# Patient Record
Sex: Female | Born: 1990 | Race: White | Hispanic: No | Marital: Married | Smoking: Never smoker
Health system: Southern US, Community
[De-identification: ages and names within clinical notes are randomized; demographics above are authoritative.]

## PROBLEM LIST (undated history)

## (undated) DIAGNOSIS — Z789 Other specified health status: Secondary | ICD-10-CM

## (undated) HISTORY — DX: Other specified health status: Z78.9

## (undated) HISTORY — PX: NO PAST SURGERIES: SHX2092

---

## 2011-09-18 ENCOUNTER — Ambulatory Visit (INDEPENDENT_AMBULATORY_CARE_PROVIDER_SITE_OTHER): Payer: BC Managed Care – PPO

## 2011-09-18 DIAGNOSIS — J069 Acute upper respiratory infection, unspecified: Secondary | ICD-10-CM

## 2012-08-30 ENCOUNTER — Ambulatory Visit (INDEPENDENT_AMBULATORY_CARE_PROVIDER_SITE_OTHER): Payer: BC Managed Care – PPO | Admitting: Physician Assistant

## 2012-08-30 VITALS — BP 118/66 | HR 90 | Temp 98.4°F | Resp 16 | Ht 66.0 in | Wt 110.2 lb

## 2012-08-30 DIAGNOSIS — J029 Acute pharyngitis, unspecified: Secondary | ICD-10-CM

## 2012-08-30 DIAGNOSIS — R05 Cough: Secondary | ICD-10-CM

## 2012-08-30 DIAGNOSIS — J020 Streptococcal pharyngitis: Secondary | ICD-10-CM

## 2012-08-30 LAB — POCT RAPID STREP A (OFFICE): Rapid Strep A Screen: POSITIVE — AB

## 2012-08-30 MED ORDER — AMOXICILLIN 875 MG PO TABS: 875.0000 mg | ORAL_TABLET | Freq: Two times a day (BID) | ORAL | Status: DC

## 2012-08-30 MED ORDER — PENICILLIN G BENZATHINE 1200000 UNIT/2ML IM SUSP
1.2000 10*6.[IU] | Freq: Once | INTRAMUSCULAR | Status: AC
  Administered 2012-08-30: 1.2 10*6.[IU] via INTRAMUSCULAR

## 2012-08-30 MED ORDER — HYDROCOD POLST-CHLORPHEN POLST 10-8 MG/5ML PO LQCR: 5.0000 mL | Freq: Two times a day (BID) | ORAL | Status: DC | PRN

## 2012-08-30 NOTE — Progress Notes (Signed)
  Subjective:    Patient ID: Patricia Swanson, female    DOB: 1991-05-18, 21 y.o.   MRN: [id number]  HPI This 21 y.o. female presents for evaluation of sore throat. Sore throat began yesterday, awoke this morning early with post-nasal drainage.  Has had a cough for 1-2 weeks, sometimes productive.  No fever, chills, GI/GU symptoms.  No known sick contacts.  Review of Systems As above.   History reviewed. No pertinent past medical history.  History reviewed. No pertinent past surgical history.  Prior to Admission medications   Not on File    No Known Allergies  History   Social History  . Marital Status: Single    Spouse Name: N/A    Number of Children: 0  . Years of Education: N/A   Occupational History  . Student     [employer]   Social History Main Topics  . Smoking status: Never Smoker   . Smokeless tobacco: Never Used  . Alcohol Use: No  . Drug Use: No  . Sexually Active: No   Other Topics Concern  . 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  Hopes to become a Marine scientist. Lives off campus with a roommate. Family lives in [place], [place].    Family History  Problem Relation Age of Onset  . Diabetes Maternal Grandmother   . Diabetes Paternal Grandmother        Objective:   Physical Exam Blood pressure 118/66, pulse 90, temperature 98.4 F (36.9 C), temperature source Oral, resp. rate 16, height 5\' 6"  (1.676 m), weight 110 lb 3.2 oz (49.986 kg), SpO2 100.00%. Body mass index is 17.79 kg/(m^2). Well-developed, well nourished WF who is awake, alert and oriented, in NAD. HEENT: /AT, PERRL, EOMI.  Sclera and conjunctiva are clear.  EAC are patent, TMs are normal in appearance. Nasal mucosa is pink and moist. OP is notable for vasculitic-type erythematous lesions on the soft-palate. No tonsillar exudate or erythema or enlargement. Neck: supple, non-tender, no lymphadenopathy, thyromegaly. Heart: RRR, no murmur Lungs: normal effort,  CTA Abdomen: normo-active bowel sounds, supple, non-tender, no mass or organomegaly. Extremities: no cyanosis, clubbing or edema. Skin: warm and dry without rash. Psychologic: good mood and appropriate affect, normal speech and behavior.  Results for orders placed in visit on 08/30/12  POCT RAPID STREP A (OFFICE)      Component Value Range   Rapid Strep A Screen Positive (*) Negative      Assessment & Plan:   1. Streptococcal sore throat  penicillin g benzathine (BICILLIN LA) 1200000 UNIT/2ML injection 1.2 Million Units, DISCONTINUED: amoxicillin (AMOXIL) 875 MG tablet  2. Acute pharyngitis  POCT rapid strep A  3. Cough  chlorpheniramine-HYDROcodone (TUSSIONEX PENNKINETIC ER) 10-8 MG/5ML [facility]   Supportive care.

## 2012-08-30 NOTE — Patient Instructions (Signed)

## 2015-05-03 ENCOUNTER — Encounter: Payer: Self-pay | Admitting: Emergency Medicine

## 2015-05-03 ENCOUNTER — Ambulatory Visit
Admission: EM | Admit: 2015-05-03 | Discharge: 2015-05-03 | Disposition: A | Discharge status: Home / Self Care | Payer: Managed Care, Other (non HMO) | Attending: Internal Medicine | Admitting: Internal Medicine

## 2015-05-03 DIAGNOSIS — Z79899 Other long term (current) drug therapy: Secondary | ICD-10-CM | POA: Diagnosis not present

## 2015-05-03 DIAGNOSIS — N63 Unspecified lump in breast: Secondary | ICD-10-CM | POA: Diagnosis present

## 2015-05-03 DIAGNOSIS — N6310 Unspecified lump in the right breast, unspecified quadrant: Secondary | ICD-10-CM

## 2015-05-03 LAB — COMPREHENSIVE METABOLIC PANEL
ALBUMIN: 5.1 g/dL — AB (ref 3.5–5.0)
ALK PHOS: 50 U/L (ref 38–126)
ALT: 13 U/L — AB (ref 14–54)
ANION GAP: 9 (ref 5–15)
AST: 19 U/L (ref 15–41)
BUN: 11 mg/dL (ref 6–20)
CALCIUM: 9.8 mg/dL (ref 8.9–10.3)
CO2: 26 mmol/L (ref 22–32)
Chloride: 103 mmol/L (ref 101–111)
Creatinine, Ser: 0.82 mg/dL (ref 0.44–1.00)
GFR calc Af Amer: >60 mL/min (ref >60)
Glucose, Bld: 108 mg/dL — ABNORMAL HIGH (ref 65–99)
POTASSIUM: 4 mmol/L (ref 3.5–5.1)
SODIUM: 138 mmol/L (ref 135–145)
TOTAL PROTEIN: 7.7 g/dL (ref 6.5–8.1)
Total Bilirubin: 0.5 mg/dL (ref 0.3–1.2)

## 2015-05-03 LAB — CBC WITH DIFFERENTIAL/PLATELET
BASOS ABS: 0.1 10*3/uL (ref 0–0.1)
BASOS PCT: 1 %
Eosinophils Absolute: 0.2 10*3/uL (ref 0–0.7)
Eosinophils Relative: 3 %
HCT: 38.8 % (ref 35.0–47.0)
Hemoglobin: 13.3 g/dL (ref 12.0–16.0)
LYMPHS ABS: 1.5 10*3/uL (ref 1.0–3.6)
LYMPHS PCT: 25 %
MCH: 30.6 pg (ref 26.0–34.0)
MCHC: 34.2 g/dL (ref 32.0–36.0)
MCV: 89.3 fL (ref 80.0–100.0)
MONO ABS: 0.3 10*3/uL (ref 0.2–0.9)
Monocytes Relative: 6 %
Neutro Abs: 4.1 10*3/uL (ref 1.4–6.5)
Neutrophils Relative %: 65 %
Platelets: 240 10*3/uL (ref 150–440)
RBC: 4.35 MIL/uL (ref 3.80–5.20)
RDW: 12.4 % (ref 11.5–14.5)
WBC: 6.1 10*3/uL (ref 3.6–11.0)

## 2015-05-03 NOTE — Discharge Instructions (Signed)
Your breast lump seems most likely to be a cyst. An ultrasound will help determine more precisely what it is;  Consultation with a breast specialist may be helpful in addition. I gave you the office number for Dr Olevia Perches, in [place], as a possible PCP. Labs today at the urgent care (CBC, CMP) were unremarkable. Breast ultrasound scheduled for Monday 7/25 at 2:20pm, at the [facility] Call [phone] to see if earlier appointment comes open.  Breast Cyst A breast cyst is a sac in the breast that is filled with fluid. Breast cysts are common in women. Women can have one or many cysts. When the breasts contain many cysts, it is usually due to a noncancerous (benign) condition called fibrocystic change. These lumps form under the influence of female hormones (estrogen and progesterone). The lumps are most often located in the upper, outer portion of the breast. They are often more swollen, painful, and tender before your period starts. They usually disappear after menopause, unless you are on hormone therapy.  There are several types of cysts:  Macrocyst. This is a cyst that is about 2 in. (5.1 cm) in diameter.   Microcyst. This is a tiny cyst that you cannot feel but can be seen with a mammogram or an ultrasound.   Galactocele. This is a cyst containing milk that may develop if you suddenly stop breastfeeding.   Sebaceous cyst of the skin. This type of cyst is not in the breast tissue itself. Breast cysts do not increase your risk of breast cancer. However, they must be monitored closely because they can be cancerous.  CAUSES  It is not known exactly what causes a breast cyst to form. Possible causes include:  An overgrowth of milk glands and connective tissue in the breast can block the milk glands, causing them to fill with fluid.   Scar tissue in the breast from previous surgery may block the glands, causing a cyst.  RISK FACTORS Estrogen may influence the  development of a breast cyst.  SIGNS AND SYMPTOMS   Feeling a smooth, round, soft lump (like a grape) in the breast that is easily moveable.   Breast discomfort or pain.  Increase in size of the lump before your menstrual period and decrease in its size after your menstrual period.  DIAGNOSIS  A cyst can be felt during a physical exam by your health care provider. A breast X-ray exam (mammogram) and ultrasonography will be done to confirm the diagnosis. Fluid may be removed from the cyst with a needle (fine needle aspiration) to make sure the cyst is not cancerous.  TREATMENT  Treatment may not be necessary. Your health care provider may monitor the cyst to see if it goes away on its own. If treatment is needed, it may include:  Hormone treatment.   Needle aspiration. There is a chance of the cyst coming back after aspiration.   Surgery to remove the whole cyst.  HOME CARE INSTRUCTIONS   Keep all follow-up appointments with your health care provider.  See your health care provider regularly:  Get a yearly exam by your health care provider.  Have a clinical breast exam by a health care provider every 1-3 years if you are 48-20 years of age. After age 57 years, you should have the exam every year.   Get mammogram tests as directed by your health care provider.   Understand the normal appearance and feel of your breasts and perform breast self-exams.   Only take  over-the-counter or prescription medicines as directed by your health care provider.   Wear a supportive bra, especially when exercising.   Avoid caffeine.   Reduce your salt intake, especially before your menstrual period. Too much salt can cause fluid retention, breast swelling, and discomfort.  SEEK MEDICAL CARE IF:   You feel, or think you feel, a lump in your breast.   You notice that both breasts look or feel different than usual.   Your breast is still causing pain after your menstrual period  is over.   You need medicine for breast pain and swelling that occurs with your menstrual period.  SEEK IMMEDIATE MEDICAL CARE IF:   You have severe pain, tenderness, redness, or warmth in your breast.   You have nipple discharge or bleeding.   Your breast lump becomes hard and painful.   You find new lumps or bumps that were not there before.   You feel lumps in your armpit (axilla).   You notice dimpling or wrinkling of the breast or nipple.   You have a fever.  MAKE SURE YOU:  Understand these instructions.  Will watch your condition.  Will get help right away if you are not doing well or get worse. Document Released: 09/29/2005 Document Revised: 06/01/2013 Document Reviewed: 04/28/2013 [facility] Patient Information 2015 [place], [place]. This information is not intended to replace advice given to you by your health care provider. Make sure you discuss any questions you have with your health care provider.

## 2015-05-03 NOTE — ED Provider Notes (Signed)
CSN: [id number]     Arrival date & time 05/03/15  1107 History   First MD Initiated Contact with Patient 05/03/15 1240     Chief Complaint  Patient presents with  . Breast Mass    right breast   HPI  Patient is a 24 year old today with a one-month history of a nontender, mobile lump above her right nipple for about the last 4 weeks. Prior to that time, for about 3 weeks, she had a similar but larger, tender, lump in her right axilla, that spontaneously resolved. Her PCP is in [place]. She is currently residing in [place], nearly the hospital. She feels fine otherwise, except for mild fatigue, which she thinks may be due to staying up late and getting up early.  History reviewed. No pertinent past medical history. History reviewed. No pertinent past surgical history. Family History  Problem Relation Age of Onset  . Diabetes Maternal Grandmother   . Diabetes Paternal Grandmother    History  Substance Use Topics  . Smoking status: Never Smoker   . Smokeless tobacco: Never Used  . Alcohol Use: No   OB History    Gravida Para Term Preterm AB TAB SAB Ectopic Multiple Living   0 0 0 0 0 0 0 0 0 0      Review of Systems  All other systems reviewed and are negative.   Allergies  Review of patient's allergies indicates no known allergies.  Home Medications   Prior to Admission medications   Medication Sig Start Date End Date Taking? Authorizing Provider  chlorpheniramine-HYDROcodone (TUSSIONEX PENNKINETIC ER) 10-8 MG/5ML LQCR Take 5 mLs by mouth every 12 (twelve) hours as needed (cough). 08/30/12   Chelle Jeffery, PA-C   BP 111/58 mmHg  Pulse 95  Temp(Src) 97.7 F (36.5 C) (Tympanic)  Resp 16  Ht 5\' 6"  (1.676 m)  Wt 120 lb (54.432 kg)  BMI 19.38 kg/m2  SpO2 97%  LMP 04/24/2015 (Exact Date) Physical Exam  Constitutional: She is oriented to person, place, and time. No distress.  Alert, nicely groomed  HENT:  Head: Atraumatic.  Eyes:  Conjugate gaze, no eye  redness/drainage  Neck: Neck supple.  Cardiovascular: Normal rate.   Pulmonary/Chest: No respiratory distress.    Patient with a smooth, mobile, 1 inch by half inch cystic area at the 12:00 position above the areola. No skin changes, no induration, no erythema/bruising apparent. Nontender. No fluctuance.  Abdominal: She exhibits no distension.  Musculoskeletal: Normal range of motion.  No leg swelling  Neurological: She is alert and oriented to person, place, and time.  Skin: Skin is warm and dry.  No cyanosis  Nursing note and vitals reviewed.   ED Course  Procedures  Results for orders placed or performed during the hospital encounter of 05/03/15  Comprehensive metabolic panel  Result Value Ref Range   Sodium 138 135 - 145 mmol/L   Potassium 4.0 3.5 - 5.1 mmol/L   Chloride 103 101 - 111 mmol/L   CO2 26 22 - 32 mmol/L   Glucose, Bld 108 (H) 65 - 99 mg/dL   BUN 11 6 - 20 mg/dL   Creatinine, Ser 4.09 0.44 - 1.00 mg/dL   Calcium 9.8 8.9 - 81.1 mg/dL   Total Protein 7.7 6.5 - 8.1 g/dL   Albumin 5.1 (H) 3.5 - 5.0 g/dL   AST 19 15 - 41 U/L   ALT 13 (L) 14 - 54 U/L   Alkaline Phosphatase 50 38 - 126 U/L   Total Bilirubin  0.5 0.3 - 1.2 mg/dL   GFR calc non Af Amer >60 >60 mL/min   GFR calc Af Amer >60 >60 mL/min   Anion gap 9 5 - 15  CBC with Differential  Result Value Ref Range   WBC 6.1 3.6 - 11.0 K/uL   RBC 4.35 3.80 - 5.20 MIL/uL   Hemoglobin 13.3 12.0 - 16.0 g/dL   HCT 16.1 09.6 - 04.5 %   MCV 89.3 80.0 - 100.0 fL   MCH 30.6 26.0 - 34.0 pg   MCHC 34.2 32.0 - 36.0 g/dL   RDW 40.9 81.1 - 91.4 %   Platelets 240 150 - 440 K/uL   Neutrophils Relative % 65 %   Neutro Abs 4.1 1.4 - 6.5 K/uL   Lymphocytes Relative 25 %   Lymphs Abs 1.5 1.0 - 3.6 K/uL   Monocytes Relative 6 %   Monocytes Absolute 0.3 0.2 - 0.9 K/uL   Eosinophils Relative 3 %   Eosinophils Absolute 0.2 0 - 0.7 K/uL   Basophils Relative 1 %   Basophils Absolute 0.1 0 - 0.1 K/uL     MDM   1. Mass of  right breast    Your breast lump seems most likely to be a cyst. An ultrasound will help determine more precisely what it is;  Consultation with a breast specialist may be helpful in addition. The office number for Dr Olevia Perches, in [place], was given as a possible PCP. Labs today at the urgent care (CBC, CMP) were unremarkable. Breast ultrasound scheduled for Monday 7/25 at 2:20pm, at the [facility]. Consider referral to breast specialist/general surgeon depending on ultrasound findings.   Eustace Moore, MD 05/03/15 [phone]

## 2015-05-03 NOTE — ED Notes (Signed)
Patient c/o non-tender mobile lump on her R breast for the past month.

## 2015-05-04 ENCOUNTER — Ambulatory Visit
Admission: RE | Admit: 2015-05-04 | Discharge: 2015-05-04 | Disposition: A | Discharge status: Home / Self Care | Payer: Managed Care, Other (non HMO) | Source: Ambulatory Visit | Attending: Internal Medicine | Admitting: Internal Medicine

## 2015-05-04 ENCOUNTER — Other Ambulatory Visit: Payer: Self-pay | Admitting: Internal Medicine

## 2015-05-04 DIAGNOSIS — N6310 Unspecified lump in the right breast, unspecified quadrant: Secondary | ICD-10-CM

## 2015-05-04 DIAGNOSIS — N63 Unspecified lump in breast: Secondary | ICD-10-CM | POA: Insufficient documentation

## 2015-05-07 ENCOUNTER — Ambulatory Visit: Payer: Managed Care, Other (non HMO)

## 2015-10-26 ENCOUNTER — Emergency Department
Admission: EM | Admit: 2015-10-26 | Discharge: 2015-10-26 | Disposition: A | Discharge status: Home / Self Care | Payer: BLUE CROSS/BLUE SHIELD | Attending: Emergency Medicine | Admitting: Emergency Medicine

## 2015-10-26 DIAGNOSIS — K5900 Constipation, unspecified: Secondary | ICD-10-CM | POA: Diagnosis present

## 2015-10-26 DIAGNOSIS — K5641 Fecal impaction: Secondary | ICD-10-CM

## 2015-10-26 MED ORDER — HYDROCORTISONE 2.5 % RE CREA: 1.0000 "application " | TOPICAL_CREAM | Freq: Two times a day (BID) | RECTAL | Status: DC

## 2015-10-26 NOTE — ED Notes (Addendum)
Pt c/o constipation X 3 days. Denies NV. Pt took stool softener and suppository with no relief. Pt straining when attempting BM, blood in toilet after attempt for BM X 1. Pt belives that she may have hemmorhoids No bleeding except when straining. Pt alert and oriented X4, active, cooperative, pt in NAD. RR even and unlabored, color WNL.

## 2015-10-26 NOTE — ED Notes (Signed)
AAOx3.  Skin warm and dry.  nad 

## 2015-10-26 NOTE — ED Provider Notes (Signed)
[facility] Emergency Department Provider Note ____________________________________________  Time seen: 2315  I have reviewed the triage vital signs and the nursing notes.  HISTORY  Chief Complaint  Constipation  HPI Patricia Swanson is a 25 y.o. female process of the ED for evaluation of 3 days of constipation. Eyes any interim nausea, or vomiting. She describes typically she has normal bowel movements about 3-4  timesper week. She notes that she has to use a stool softener today as well as a glycerin suppository over-the-counter, without significant relief. She did have the urge for bowel movement after the suppository was inserted. But at that time she did describes only bright red blood in the toilet after straining. She describes being unable to pass what feels like a large stool in the lower part of the rectum. She really has a poor diet according to her description. She has recently began to increase fiber over the last few days. She denies any significant discomfort in the abdomen or any rectal pain at this time.  History reviewed. No pertinent past medical history.  There are no active problems to display for this patient.  History reviewed. No pertinent past surgical history.  Current Outpatient Rx  Name  Route  Sig  Dispense  Refill  . chlorpheniramine-HYDROcodone (TUSSIONEX PENNKINETIC ER) 10-8 MG/5ML LQCR   Oral   Take 5 mLs by mouth every 12 (twelve) hours as needed (cough).   140 mL   0   . hydrocortisone (PROCTOSOL HC) 2.5 % rectal cream   Rectal   Place 1 application rectally 2 (two) times daily.   30 g   0    Allergies Review of patient's allergies indicates no known allergies.  Family History  Problem Relation Age of Onset  . Diabetes Maternal Grandmother   . Diabetes Paternal Grandmother     Social History Social History  Substance Use Topics  . Smoking status: Never Smoker   . Smokeless tobacco: Never Used  . Alcohol Use: No    Review of Systems  Constitutional: Negative for fever. Eyes: Negative for visual changes. ENT: Negative for sore throat. Cardiovascular: Negative for chest pain. Respiratory: Negative for shortness of breath. Gastrointestinal: Negative for abdominal pain, vomiting and diarrhea. Reports constipation as above Genitourinary: Negative for dysuria. Musculoskeletal: Negative for back pain. Skin: Negative for rash. Neurological: Negative for headaches, focal weakness or numbness. ____________________________________________  PHYSICAL EXAM:  VITAL SIGNS: ED Triage Vitals  Enc Vitals Group     BP 10/26/15 2206 135/70 mmHg     Pulse Rate 10/26/15 2206 112     Resp 10/26/15 2206 18     Temp 10/26/15 2206 97.9 F (36.6 C)     Temp Source 10/26/15 2206 Oral     SpO2 10/26/15 2206 100 %     Weight 10/26/15 2206 115 lb (52.164 kg)     Height 10/26/15 2206 5\' 6"  (1.676 m)     Head Cir --      Peak Flow --      Pain Score 10/26/15 2343 0     Pain Loc --      Pain Edu? --      Excl. in GC? --    Constitutional: Alert and oriented. Well appearing and in no distress. Head: Normocephalic and atraumatic.      Eyes: Conjunctivae are normal. PERRL. Normal extraocular movements      Ears: Canals clear. TMs intact bilaterally.   Nose: No congestion/rhinorrhea.   Mouth/Throat: Mucous membranes are  moist.   Neck: Supple. No thyromegaly. Hematological/Lymphatic/Immunological: No cervical lymphadenopathy. Cardiovascular: Normal rate, regular rhythm.  Respiratory: Normal respiratory effort. No wheezes/rales/rhonchi. Gastrointestinal: Soft and nontender. No distention, rebound, guarding, or organomegaly. Musculoskeletal: Nontender with normal range of motion in all extremities.  Neurologic:  Normal gait without ataxia. Normal speech and language. No gross focal neurologic deficits are appreciated. Skin:  Skin is warm, dry and intact. No rash noted. Psychiatric: Mood and affect are  normal. Patient exhibits appropriate insight and judgment. ____________________________________________   INITIAL IMPRESSION / ASSESSMENT AND PLAN / ED COURSE  Patient with a report of constipation for 3 days. She also clinically describes what is likely a small fecal impaction. She is encouraged to utilize over-the-counter fleets enema as well as an attempt at self manual disimpaction. He is also given advice on the use of daily MiraLAX and fiber supplements. She is also given instruction on the use of magnesium citrate to help with passage of stool. She is discharged with a prescription for hydrocortisone cream to use as needed for rectal pain and bleeding secondary to fissures and/or hemorrhage. She will follow up with the primary care provider or return to the ED as needed for intractable constipation or fecal impaction. ____________________________________________  FINAL CLINICAL IMPRESSION(S) / ED DIAGNOSES  Final diagnoses:  Constipation, unspecified constipation type  Impacted [facility])      Lissa Hoard, PA-C 10/26/15 2352  Myrna Blazer, MD 10/26/15 [phone]

## 2015-10-26 NOTE — Discharge Instructions (Signed)
Constipation, Adult Constipation is when a person has fewer than three bowel movements a week, has difficulty having a bowel movement, or has stools that are dry, hard, or larger than normal. As people grow older, constipation is more common. A low-fiber diet, not taking in enough fluids, and taking certain medicines may make constipation worse.  CAUSES   Certain medicines, such as antidepressants, pain medicine, iron supplements, antacids, and water pills.   Certain diseases, such as diabetes, irritable bowel syndrome (IBS), thyroid disease, or depression.   Not drinking enough water.   Not eating enough fiber-rich foods.   Stress or travel.   Lack of physical activity or exercise.   Ignoring the urge to have a bowel movement.   Using laxatives too much.  SIGNS AND SYMPTOMS   Having fewer than three bowel movements a week.   Straining to have a bowel movement.   Having stools that are hard, dry, or larger than normal.   Feeling full or bloated.   Pain in the lower abdomen.   Not feeling relief after having a bowel movement.  DIAGNOSIS  Your health care provider will take a medical history and perform a physical exam. Further testing may be done for severe constipation. Some tests may include:  A barium enema X-ray to examine your rectum, colon, and, sometimes, your small intestine.   A sigmoidoscopy to examine your lower colon.   A colonoscopy to examine your entire colon. TREATMENT  Treatment will depend on the severity of your constipation and what is causing it. Some dietary treatments include drinking more fluids and eating more fiber-rich foods. Lifestyle treatments may include regular exercise. If these diet and lifestyle recommendations do not help, your health care provider may recommend taking over-the-counter laxative medicines to help you have bowel movements. Prescription medicines may be prescribed if over-the-counter medicines do not work.    HOME CARE INSTRUCTIONS   Eat foods that have a lot of fiber, such as fruits, vegetables, whole grains, and beans.  Limit foods high in fat and processed sugars, such as french fries, hamburgers, cookies, candies, and soda.   A fiber supplement may be added to your diet if you cannot get enough fiber from foods.   Drink enough fluids to keep your urine clear or pale yellow.   Exercise regularly or as directed by your health care provider.   Go to the restroom when you have the urge to go. Do not hold it.   Only take over-the-counter or prescription medicines as directed by your health care provider. Do not take other medicines for constipation without talking to your health care provider first.  [place] IF:   You have bright red blood in your stool.   Your constipation lasts for more than 4 days or gets worse.   You have abdominal or rectal pain.   You have thin, pencil-like stools.   You have unexplained weight loss. MAKE SURE YOU:   Understand these instructions.  Will watch your condition.  Will get help right away if you are not doing well or get worse.   This information is not intended to replace advice given to you by your health care provider. Make sure you discuss any questions you have with your health care provider.   Document Released: 06/27/2004 Document Revised: 10/20/2014 Document Reviewed: 07/11/2013 Elsevier Interactive Patient Education 2016 [employer].  Fecal Impaction A fecal impaction happens when there is a large, firm amount of stool (or feces) that cannot  be passed. The impacted stool is usually in the rectum, which is the lowest part of the large bowel. The impacted stool can block the colon and cause significant problems. CAUSES  The longer stool stays in the rectum, the harder it gets. Anything that slows down your bowel movements can lead to fecal impaction, such as:  Constipation. This can be a long-standing  (chronic) problem or can happen suddenly (acute).  Painful conditions of the rectum, such as hemorrhoids or anal fissures. The pain of these conditions can make you try to avoid having bowel movements.  Narcotic pain-relieving medicines, such as methadone, morphine, or codeine.  Not drinking enough fluids.  Inactivity and bed rest over long periods of time.  Diseases of the brain or nervous system that damage the nerves controlling the muscles of the intestines. SIGNS AND SYMPTOMS   Lack of normal bowel movements or changes in bowel patterns.  Sense of fullness in the rectum but unable to pass stool.  Pain or cramps in the abdominal area (often after meals).  Thin, watery discharge from the rectum. DIAGNOSIS  Your health care provider may suspect that you have a fecal impaction based on your symptoms and a physical exam. This will include an exam of your rectum. Sometimes X-rays or lab testing may be needed to confirm the diagnosis and to be sure there are no other problems.  TREATMENT   Initially an impaction can be removed manually. Using a gloved finger, your health care provider can remove hard stool from your rectum.  Medicine is sometimes needed. A suppository or enema can be given in the rectum to soften the stool, which can stimulate a bowel movement. Medicines can also be given by mouth (orally).  Though rare, surgery may be needed if the colon has torn (perforated) due to blockage. HOME CARE INSTRUCTIONS   Develop regular bowel habits. This could include getting in the habit of having a bowel movement after your morning cup of coffee or after eating. Be sure to allow yourself enough time on the toilet.  Maintain a high-fiber diet.  Drink enough fluids to keep your urine clear or pale yellow as directed by your health care provider.  Exercise regularly.  If you begin to get constipated, increase the amount of fiber in your diet. Eat plenty of fruits, vegetables, whole  wheat breads, bran, oatmeal, and similar products.  Take natural fiber laxatives or other laxatives only as directed by your health care provider. SEEK MEDICAL CARE IF:   You have ongoing rectal pain.  You require enemas or suppositories more than twice a week.  You have rectal bleeding.  You have continued problems, or you develop abdominal pain.  You have thin, pencil-like stools. SEEK IMMEDIATE MEDICAL CARE IF:  You have black or tarry stools. MAKE SURE YOU:   Understand these instructions.  Will watch your condition.  Will get help right away if you are not doing well or get worse.   This information is not intended to replace advice given to you by your health care provider. Make sure you discuss any questions you have with your health care provider.   Document Released: 06/21/2004 Document Revised: 07/20/2013 Document Reviewed: 04/05/2013 Elsevier Interactive Patient Education 2016 [employer] Inc.  High-Fiber Diet Fiber, also called dietary fiber, is a type of carbohydrate found in fruits, vegetables, whole grains, and beans. A high-fiber diet can have many health benefits. Your health care provider may recommend a high-fiber diet to help:  Prevent constipation. Fiber  can make your bowel movements more regular.  Lower your cholesterol.  Relieve hemorrhoids, uncomplicated diverticulosis, or irritable bowel syndrome.  Prevent overeating as part of a weight-loss plan.  Prevent heart disease, type 2 diabetes, and certain cancers. WHAT IS MY PLAN? The recommended daily intake of fiber includes:  38 grams for men under age 65.  30 grams for men over age 46.  25 grams for women under age 75.  21 grams for women over age 70. You can get the recommended daily intake of dietary fiber by eating a variety of fruits, vegetables, grains, and beans. Your health care provider may also recommend a fiber supplement if it is not possible to get enough fiber through your  diet. WHAT DO I NEED TO KNOW ABOUT A HIGH-FIBER DIET?  Fiber supplements have not been widely studied for their effectiveness, so it is better to get fiber through food sources.  Always check the fiber content on thenutrition facts label of any prepackaged food. Look for foods that contain at least 5 grams of fiber per serving.  Ask your dietitian if you have questions about specific foods that are related to your condition, especially if those foods are not listed in the following section.  Increase your daily fiber consumption gradually. Increasing your intake of dietary fiber too quickly may cause bloating, cramping, or gas.  Drink plenty of water. Water helps you to digest fiber. WHAT FOODS CAN I EAT? Grains Whole-grain breads. Multigrain cereal. Oats and oatmeal. Brown rice. Barley. Bulgur wheat. Millet. Bran muffins. Popcorn. Rye wafer crackers. Vegetables Sweet potatoes. Spinach. Kale. Artichokes. Cabbage. Broccoli. Green peas. Carrots. Squash. Fruits Berries. Pears. Apples. Oranges. Avocados. Prunes and raisins. Dried figs. Meats and Other Protein Sources Navy, kidney, pinto, and soy beans. Split peas. Lentils. Nuts and seeds. Dairy Fiber-fortified yogurt. Beverages Fiber-fortified soy milk. Fiber-fortified orange juice. Other Fiber bars. The items listed above may not be a complete list of recommended foods or beverages. Contact your dietitian for more options. WHAT FOODS ARE NOT RECOMMENDED? Grains White bread. Pasta made with refined flour. White rice. Vegetables Fried potatoes. Canned vegetables. Well-cooked vegetables.  Fruits Fruit juice. Cooked, strained fruit. Meats and Other Protein Sources Fatty cuts of meat. Fried Environmental education officer or fried fish. Dairy Milk. Yogurt. Cream cheese. Sour cream. Beverages Soft drinks. Other Cakes and pastries. Butter and oils. The items listed above may not be a complete list of foods and beverages to avoid. Contact your dietitian for  more information. WHAT ARE SOME TIPS FOR INCLUDING HIGH-FIBER FOODS IN MY DIET?  Eat a wide variety of high-fiber foods.  Make sure that half of all grains consumed each day are whole grains.  Replace breads and cereals made from refined flour or white flour with whole-grain breads and cereals.  Replace white rice with brown rice, bulgur wheat, or millet.  Start the day with a breakfast that is high in fiber, such as a cereal that contains at least 5 grams of fiber per serving.  Use beans in place of meat in soups, salads, or pasta.  Eat high-fiber snacks, such as berries, raw vegetables, nuts, or popcorn.   This information is not intended to replace advice given to you by your health care provider. Make sure you discuss any questions you have with your health care provider.   Document Released: 09/29/2005 Document Revised: 10/20/2014 Document Reviewed: 03/14/2014 Elsevier Interactive Patient Education 2016 [employer].  Use the OTC glycerin suppositories 1 time a day for constipation. Use an OTC  Fleet's Enema to help loosen stools. You may drink a bottle of magnesium citrate, according to the instructions for constipation. Consider a trail of manual disimpaction as demonstrated for relief.

## 2015-11-06 IMAGING — US US BREAST LTD UNI RIGHT INC AXILLA
1 series · 3 of 3 positions shown · non-contrast
Comparison: None.

CLINICAL DATA: Mass felt by the patient in the upper right breast
for the past month. She reports that it has gotten slightly smaller
during that time. No family history of breast cancer.

EXAM:
ULTRASOUND OF THE RIGHT BREAST

[Series 1: us breast ltd uni right inc axilla · 0.08mm/px · 3 of 3 slices shown]
[im 1/3]
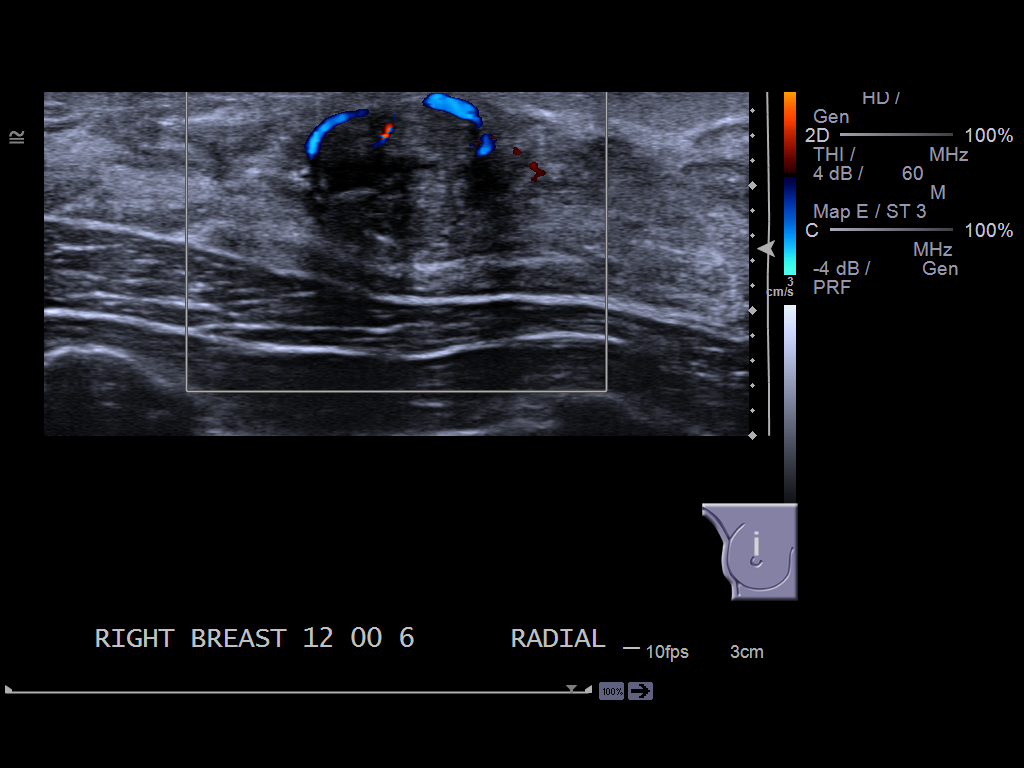
[im 2/3]
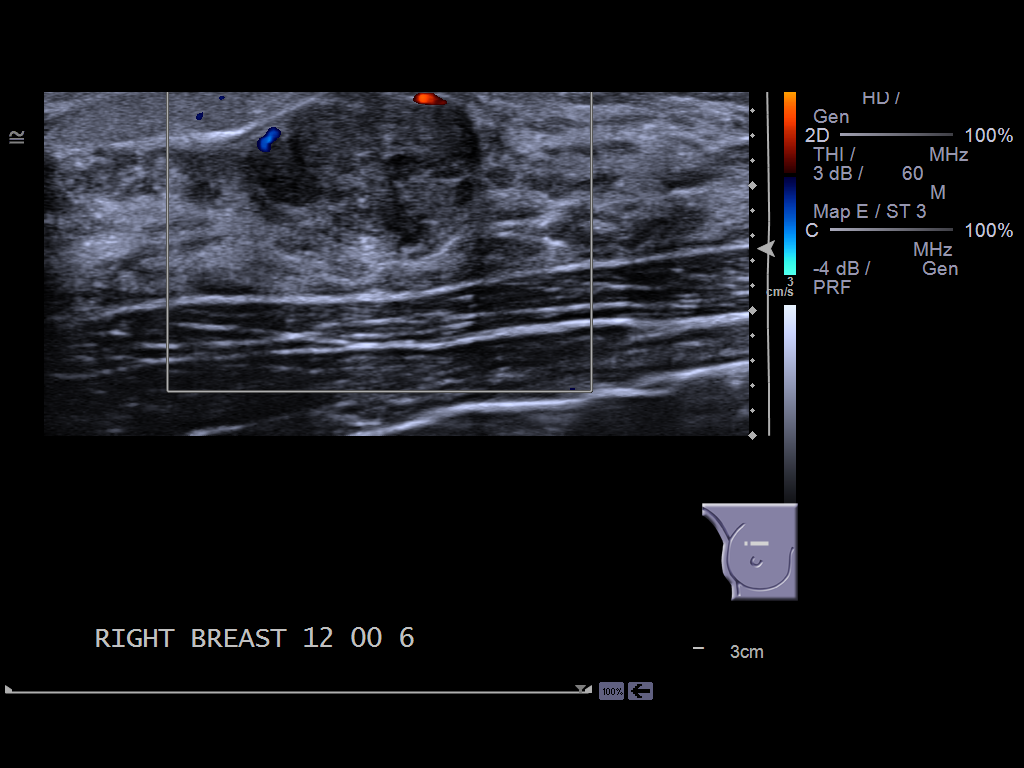
[im 3/3]
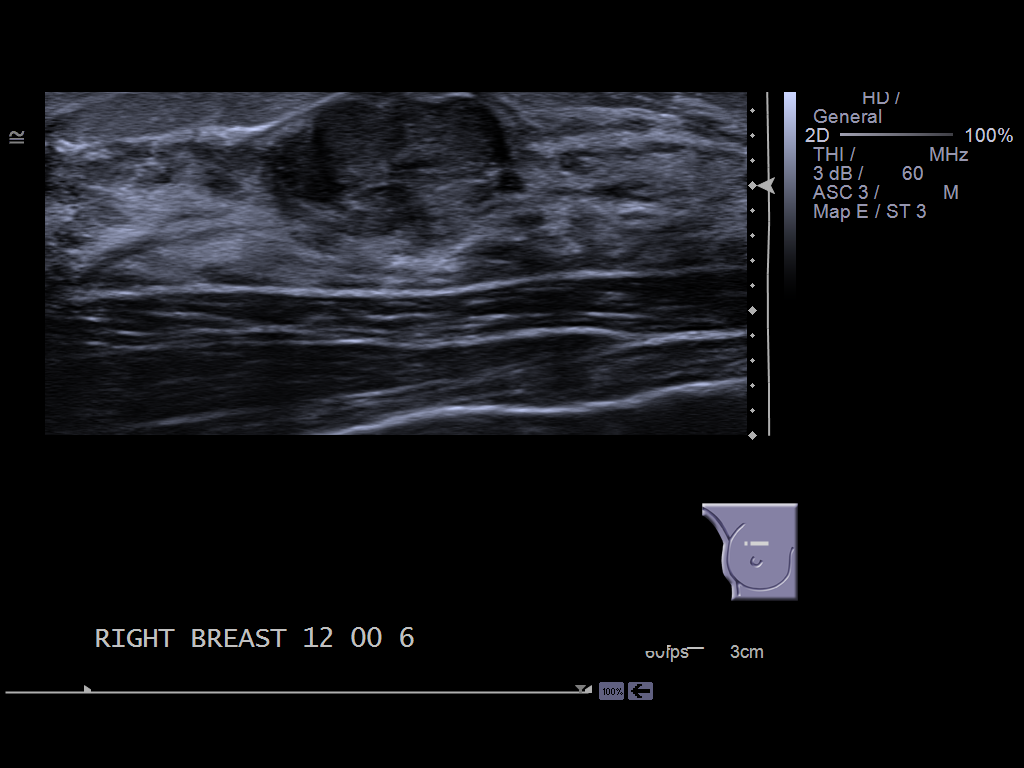

[3 of 3 positions shown; findings below may reference images not displayed]

FINDINGS: On physical exam, the patient has an approximately 1 cm oval,
circumscribed, mobile palpable mass in the 12 o'clock position of
the right breast, 6 cm from the nipple. There are no palpable right
axillary lymph nodes.

Targeted ultrasound is performed, showing a 2.2 x 2.1 x 1.4 cm oval,
horizontally oriented, circumscribed solid mass in the 12 o'clock
position of the right breast, 6 cm from the nipple. This contains
some thin internal septations.

Ultrasound of the right axilla demonstrated normal appearing right
axillary lymph nodes.
IMPRESSION: 2.2 cm arm probable fibroadenoma in the 12 o'clock position of the
right breast.

RECOMMENDATION:
Right breast ultrasound in 6 months. The options of
ultrasound-guided core needle biopsy and surgical excision were also
discussed with the patient but not recommended at this time. She is
currently comfortable with the 6 month followup ultrasound.

I have discussed the findings and recommendations with the patient.
Results were also provided in writing at the conclusion of the
visit. If applicable, a reminder letter will be sent to the patient
regarding the next appointment.

BI-RADS CATEGORY  3: Probably benign.

## 2018-04-07 ENCOUNTER — Encounter: Payer: Self-pay | Admitting: Physician Assistant

## 2018-04-07 ENCOUNTER — Ambulatory Visit (INDEPENDENT_AMBULATORY_CARE_PROVIDER_SITE_OTHER): Payer: No Typology Code available for payment source | Admitting: Physician Assistant

## 2018-04-07 ENCOUNTER — Ambulatory Visit: Payer: BLUE CROSS/BLUE SHIELD | Admitting: Osteopathic Medicine

## 2018-04-07 ENCOUNTER — Encounter (INDEPENDENT_AMBULATORY_CARE_PROVIDER_SITE_OTHER): Payer: Self-pay

## 2018-04-07 VITALS — BP 119/74 | HR 81 | Resp 16 | Ht 66.0 in | Wt 120.0 lb

## 2018-04-07 DIAGNOSIS — Z13220 Encounter for screening for lipoid disorders: Secondary | ICD-10-CM

## 2018-04-07 DIAGNOSIS — Z7689 Persons encountering health services in other specified circumstances: Secondary | ICD-10-CM | POA: Diagnosis not present

## 2018-04-07 DIAGNOSIS — Z0000 Encounter for general adult medical examination without abnormal findings: Secondary | ICD-10-CM | POA: Diagnosis not present

## 2018-04-07 DIAGNOSIS — Z131 Encounter for screening for diabetes mellitus: Secondary | ICD-10-CM

## 2018-04-07 DIAGNOSIS — Z130 Encounter for screening for diseases of the blood and blood-forming organs and certain disorders involving the immune mechanism: Secondary | ICD-10-CM | POA: Diagnosis not present

## 2018-04-07 NOTE — Patient Instructions (Signed)
Preventive Care 18-39 Years, Female Preventive care refers to lifestyle choices and visits with your health care provider that can promote health and wellness. What does preventive care include?  A yearly physical exam. This is also called an annual well check.  Dental exams once or twice a year.  Routine eye exams. Ask your health care provider how often you should have your eyes checked.  Personal lifestyle choices, including: ? Daily care of your teeth and gums. ? Regular physical activity. ? Eating a healthy diet. ? Avoiding tobacco and drug use. ? Limiting alcohol use. ? Practicing safe sex. ? Taking vitamin and mineral supplements as recommended by your health care provider. What happens during an annual well check? The services and screenings done by your health care provider during your annual well check will depend on your age, overall health, lifestyle risk factors, and family history of disease. Counseling Your health care provider may ask you questions about your:  Alcohol use.  Tobacco use.  Drug use.  Emotional well-being.  Home and relationship well-being.  Sexual activity.  Eating habits.  Work and work Statistician.  Method of birth control.  Menstrual cycle.  Pregnancy history.  Screening You may have the following tests or measurements:  Height, weight, and BMI.  Diabetes screening. This is done by checking your blood sugar (glucose) after you have not eaten for a while (fasting).  Blood pressure.  Lipid and cholesterol levels. These may be checked every 5 years starting at age 66.  Skin check.  Hepatitis C blood test.  Hepatitis B blood test.  Sexually transmitted disease (STD) testing.  BRCA-related cancer screening. This may be done if you have a family history of breast, ovarian, tubal, or peritoneal cancers.  Pelvic exam and Pap test. This may be done every 3 years starting at age 40. Starting at age 59, this may be done every 5  years if you have a Pap test in combination with an HPV test.  Discuss your test results, treatment options, and if necessary, the need for more tests with your health care provider. Vaccines Your health care provider may recommend certain vaccines, such as:  Influenza vaccine. This is recommended every year.  Tetanus, diphtheria, and acellular pertussis (Tdap, Td) vaccine. You may need a Td booster every 10 years.  Varicella vaccine. You may need this if you have not been vaccinated.  HPV vaccine. If you are 69 or younger, you may need three doses over 6 months.  Measles, mumps, and rubella (MMR) vaccine. You may need at least one dose of MMR. You may also need a second dose.  Pneumococcal 13-valent conjugate (PCV13) vaccine. You may need this if you have certain conditions and were not previously vaccinated.  Pneumococcal polysaccharide (PPSV23) vaccine. You may need one or two doses if you smoke cigarettes or if you have certain conditions.  Meningococcal vaccine. One dose is recommended if you are age 27-21 years and a first-year college student living in a residence hall, or if you have one of several medical conditions. You may also need additional booster doses.  Hepatitis A vaccine. You may need this if you have certain conditions or if you travel or work in places where you may be exposed to hepatitis A.  Hepatitis B vaccine. You may need this if you have certain conditions or if you travel or work in places where you may be exposed to hepatitis B.  Haemophilus influenzae type b (Hib) vaccine. You may need this if  you have certain risk factors.  Talk to your health care provider about which screenings and vaccines you need and how often you need them. This information is not intended to replace advice given to you by your health care provider. Make sure you discuss any questions you have with your health care provider. Document Released: 11/25/2001 Document Revised: 06/18/2016  Document Reviewed: 07/31/2015 Elsevier Interactive Patient Education  [employer].

## 2018-04-07 NOTE — Progress Notes (Signed)
HPI:                                                                Patricia Swanson is a 27 y.o. female [facility] today to establish care  Current concerns: requesting annual physical exam  Has never had a Pap smear. No health concerns.  [facility]  Decreased Interest 0  Down, Depressed, Hopeless 0  PHQ - 2 Score 0    No flowsheet data found.    History reviewed. No pertinent past medical history. Past Surgical History:  Procedure Laterality Date  . NO PAST SURGERIES     Social History   Tobacco Use  . Smoking status: Never Smoker  . Smokeless tobacco: Never Used  Substance Use Topics  . Alcohol use: Not Currently    Alcohol/week: 0.6 oz    Types: 1 Standard drinks or equivalent per week   family history includes Diabetes in her maternal grandmother and paternal grandmother.    ROS: negative except as noted in the HPI  Medications: No current outpatient medications on file.   No current facility-administered medications for this visit.    No Known Allergies     Objective:  BP 119/74   Pulse 81   Resp 16   Ht 5\' 6"  (1.676 m)   Wt 120 lb (54.4 kg)   LMP 04/07/2018 (Exact Date)   BMI 19.37 kg/m  General Appearance:  Alert, cooperative, no distress, appropriate for age                            Head:  Normocephalic, without obvious abnormality                             Eyes:  PERRL, EOM's intact, conjunctiva and cornea clear                             Ears:  TM pearly gray color and semitransparent, external ear canals normal, both ears                            Nose:  Nares symmetrical                          Throat:  Lips, tongue, and mucosa are moist, pink, and intact; good dentition                             Neck:  Supple; symmetrical, trachea midline, no adenopathy; thyroid: no enlargement, symmetric, no tenderness/mass/nodules                              Back:  Symmetrical, no curvature, ROM normal               Chest/Breast:  deferred  Lungs:  Clear to auscultation bilaterally, respirations unlabored                             Heart:  regular rate & normal rhythm, S1 and S2 normal, no murmurs, rubs, or gallops                     Abdomen:  Soft, non-tender, no mass or organomegaly              Genitourinary:  deferred         Musculoskeletal:  Tone and strength strong and symmetrical, all extremities; no joint pain or edema, normal gait and station                                      Lymphatic:  No adenopathy             Skin/Hair/Nails:  Skin warm, dry and intact, no rashes or abnormal dyspigmentation on limited exam                   Neurologic:  Alert and oriented x3, no cranial nerve deficits,  sensation grossly intact, normal gait and station, no tremor Psych: well-groomed, cooperative, good eye contact, euthymic mood, affect mood-congruent, speech is articulate, and thought processes clear and goal-directed   No results found for this or any previous visit (from the past 72 hour(s)). No results found.    Assessment and Plan: 26 y.o. female with   Encounter to establish care  Encounter for annual physical exam - Plan: CBC, Comprehensive metabolic panel, Lipid Panel w/reflex Direct LDL  Screening for lipid disorders - Plan: Lipid Panel w/reflex Direct LDL  Screening for diabetes mellitus - Plan: Comprehensive metabolic panel  Screening for blood disease - Plan: CBC, Comprehensive metabolic panel  - Personally reviewed PMH, PSH, PFH, medications, allergies, HM - Age-appropriate cancer screening: overdue for Pap smear, deferred today, she will schedule an appt - Influenza n/a - Tdap UTD - PHQ2 negative   Patient education and anticipatory guidance given Patient agrees with treatment plan Follow-up in 1-4 weeks for Pap smear or sooner as needed if symptoms worsen or fail to  improve  Levonne Hubert PA-C

## 2018-05-04 ENCOUNTER — Ambulatory Visit: Payer: No Typology Code available for payment source | Admitting: Physician Assistant

## 2018-08-02 ENCOUNTER — Ambulatory Visit: Payer: No Typology Code available for payment source | Admitting: Physician Assistant

## 2018-09-02 ENCOUNTER — Encounter: Payer: Self-pay | Admitting: Physician Assistant

## 2018-09-02 ENCOUNTER — Ambulatory Visit (INDEPENDENT_AMBULATORY_CARE_PROVIDER_SITE_OTHER): Payer: No Typology Code available for payment source | Admitting: Physician Assistant

## 2018-09-02 ENCOUNTER — Other Ambulatory Visit (HOSPITAL_COMMUNITY)
Admission: RE | Admit: 2018-09-02 | Discharge: 2018-09-02 | Disposition: A | Discharge status: Home / Self Care | Payer: No Typology Code available for payment source | Source: Ambulatory Visit | Attending: Physician Assistant | Admitting: Physician Assistant

## 2018-09-02 VITALS — BP 138/79 | HR 92 | Resp 14 | Wt 121.0 lb

## 2018-09-02 DIAGNOSIS — Z124 Encounter for screening for malignant neoplasm of cervix: Secondary | ICD-10-CM | POA: Diagnosis not present

## 2018-09-02 DIAGNOSIS — R030 Elevated blood-pressure reading, without diagnosis of hypertension: Secondary | ICD-10-CM | POA: Diagnosis not present

## 2018-09-02 DIAGNOSIS — N6310 Unspecified lump in the right breast, unspecified quadrant: Secondary | ICD-10-CM | POA: Diagnosis not present

## 2018-09-02 NOTE — Progress Notes (Signed)
HPI:                                                                Patricia Swanson is a 27 y.o. female who [facility] today for Pap smear only  Current Concerns include R breast mass  Reports 2 years ago she noticed a mass of her right breast just above the areola. She states the lump increases slightly in size during her menses. Otherwise, it is asymptomatic. Denies skin changes or nipple discharge. 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 HIV Screening  04/08/2019 (Originally 04/05/2007)  . PAP SMEAR  09/02/2021  . TETANUS/TDAP  04/07/2025  . INFLUENZA VACCINE  Completed    History reviewed. No pertinent past medical history. Past Surgical History:  Procedure Laterality Date  . NO PAST SURGERIES     Social History   Tobacco Use  . Smoking status: Never Smoker  . Smokeless tobacco: Never Used  Substance Use Topics  . Alcohol use: Not Currently    Alcohol/week: 1.0 standard drinks    Types: 1 Standard drinks or equivalent per week   family history includes Diabetes in her maternal grandmother and paternal grandmother.  ROS: negative except as noted in the HPI  Medications: No current outpatient medications on file.   No current facility-administered medications for this visit.    No Known Allergies     Objective:  BP 138/79   Pulse 92   Resp 14   Wt 121 lb (54.9 kg)   LMP 08/24/2018 (Approximate)   BMI 19.53 kg/m  Breasts: symmetric, normal appearance, Right breast, 12 o'clock position just superior to the nipple areole complex there  is an approximate 2 cm firm somewhat mobile mass, no tenderness GU: vulva without rashes or lesions, normal introitus and urethral meatus, vaginal mucosa without erythema, normal discharge, cervix non-friable without lesions, adnexa without masses or tenderness, uterus non-tender and not enlarged Lymph: no axillary adenopathy  A chaperone was used for the physical exam, Olivia MackieEvonia Henry, CMA.  BP Readings from Last 3 Encounters:  09/02/18 138/79  04/07/18 119/74  10/26/15 135/70    Study Result   CLINICAL DATA:  Mass felt by the patient in the upper right breast for the past month. She reports that it has gotten slightly smaller during that time. No family history of breast cancer.  EXAM: ULTRASOUND OF THE RIGHT BREAST  COMPARISON:  None.  FINDINGS: On physical exam, the patient has an approximately 1 cm oval, circumscribed, mobile palpable mass in the 12 o'clock position of the right breast, 6 cm from the nipple. There are no palpable right axillary lymph nodes.  Targeted ultrasound is performed, showing a 2.2 x 2.1 x 1.4 cm oval, horizontally oriented, circumscribed solid mass in the 12 o'clock position of the right breast, 6 cm from the nipple. This contains some thin internal  septations.  Ultrasound of the right axilla demonstrated normal appearing right axillary lymph nodes.  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  RECOMMENDATION: Right breast ultrasound in 6 months. The options of ultrasound-guided core needle biopsy and surgical excision were also discussed with the patient but not recommended at this time. She is currently comfortable with the 6 month followup ultrasound.  I have discussed the findings and recommendations with the patient. Results were also provided in writing at the conclusion of the visit. If applicable, a reminder letter will be sent to the patient regarding the next  appointment.  BI-RADS CATEGORY  3: Probably benign.   Electronically Signed   By: Beckie Salts M.D.   On: 05/04/2015 11:07     No results found for this or any previous visit (from the past 72 hour(s)). No results found.    Assessment and Plan: 27 y.o. female with   Patricia Swanson was seen today for gynecologic exam.  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  She is long overdue for her surveillance six-month ultrasound.  This is been ordered today. - Blood pressure noted to be elevated on 2 checks in office today. Prior BP in range.  No comorbidities.  Active surveillance.   Patient education and anticipatory guidance given Patient agrees with treatment plan Follow-up based on Pap results or sooner as needed  Levonne Hubert PA-C

## 2018-09-02 NOTE — Patient Instructions (Signed)
Fibroadenoma Fibroadenoma is a type of breast tumor that is not cancerous (is benign). These tumors are made up of breast tissue and the tissue that holds breast tissue together (connective tissue). There are several types of fibroadenomas:  Simple fibroadenoma. This is the most common type. It consists of a single type of tissue throughout the tumor.  Complex fibroadenoma. This type of tumor contains more than one kind of tissue or irregular tissue.  Juvenile fibroadenoma. This is a type of tumor that can develop in adolescent girls. It tends to grow larger over time than other adenomas.  A fibroadenoma usually occurs as a single lump, but sometimes there may be more than one lump. Fibroadenomas vary in size. They can occur in one breast or in both breasts. Some fibroadenomas are too small to feel, but a larger one may feel like a firm, smooth lump that moves beneath your fingers. Although fibroadenomas are not cancer, having a fibroadenoma may slightly increase your risk for developing breast cancer in the future. What are the causes? The exact cause of fibroadenoma is not known. What increases the risk? This condition is more likely to develop in:  Women who are 20-30 years of age.  Women of African-American descent.  What are the signs or symptoms? A fibroadenoma may not cause any symptoms. These tumors usually do not cause pain unless they grow to a large size. A fibroadenoma may feel like a lump in your breast that is:  Firm.  Round.  Smooth.  Slightly moveable.  How is this diagnosed? You may notice a breast lump during a breast self-exam. Your health care provider may discover it during a routine breast exam or mammogram. Your health care provider may suspect fibroadenoma if you have a breast lump that feels firm, round, and smooth and appears smooth on your mammogram. Other tests may be done to confirm the diagnosis, including:  An ultrasound to check for fluid inside the  lump (cystic tumor).  A procedure that uses a needle to remove fluid from a cystic tumor. The fluid is then checked under a microscope for cancer cells.  A mammogram to examine a lump that is not cystic (is solid).  A procedure that uses a needle to remove a sample of tissue from the lump (breast biopsy) to examine under a microscope. This test is the only method that can be used to confirm that a tumor is a fibroadenoma and is not cancer.  How is this treated? Treatment for this condition may include:  Having breast exams regularly to check for changes in your fibroadenoma.  Having the fibroadenoma removed. A fibroadenoma may be removed if it is: ? Large. ? Continuing to grow. ? Causing symptoms. ? Changing the appearance of your breast. ? A juvenile fibroadenoma. These tend to grow large over time.  Follow these instructions at home:   If you had a fibroadenoma removed, follow instructions from your health care provider for care after the procedure.  Perform breast self-exams at home as told by your health care provider.  Keep all follow-up visits as told by your health care provider. This is important. Contact a health care provider if:  Your fibroadenoma becomes larger, feels different, or becomes painful.  You find a new breast lump.  You have any changes in the skin that covers your breast. These include: ? Dimpling. ? Bruising. ? Thickening. ? Redness.  You have any changes in your nipple.  You have fluid leaking from your nipple. This   information is not intended to replace advice given to you by your health care provider. Make sure you discuss any questions you have with your health care provider. Document Released: 02/13/2015 Document Revised: 03/06/2016 Document Reviewed: 09/20/2014 Elsevier Interactive Patient Education  2018 Elsevier Inc.   Preventing Cervical Cancer Cervical cancer is cancer that grows on the cervix. The cervix is at the bottom of the  uterus. It connects the uterus to the vagina. The uterus is where a baby develops during pregnancy. Cancer occurs when cells become abnormal and start to grow out of control. Cervical cancer grows slowly and may not cause any symptoms at first. Over time, the cancer can grow deep into the cervix tissue and spread to other areas. If it is found early, cervical cancer can be treated effectively. You can also take steps to prevent this type of cancer. Most cases of cervical cancer are caused by an STI (sexually transmitted infection) called human papillomavirus (HPV). One way to reduce your risk of cervical cancer is to avoid infection with the HPV virus. You can do this by practicing safe sex and by getting the HPV vaccine. Getting regular Pap tests is also important because this can help identify changes in cells that could lead to cancer. Your chances of getting this disease can also be reduced by making certain lifestyle changes. How can I protect myself from cervical cancer? Preventing HPV infection  Ask your health care provider about getting the HPV vaccine. If you are 26 years old or younger, you may need to get this vaccine, which is given in three doses over 6 months. This vaccine protects against the types of HPV that could cause cancer.  Limit the number of people you have sex with. Also avoid having sex with people who have had many sex partners.  Use a latex condom during sex. Getting Pap tests  Get Pap tests regularly, starting at age 26. Talk with your health care provider about how often you need these tests. ? Most women who are 6121?26 years of age should have a Pap test every 3 years. ? Most women who are 7830?26 years of age should have a Pap test in combination with an HPV test every 5 years. ? Women with a higher risk of cervical cancer, such as those with a weakened immune system or those who have been exposed to the drug diethylstilbestrol (DES), may need more frequent  testing. Making other lifestyle changes  Do not use any products that contain nicotine or tobacco, such as cigarettes and e-cigarettes. If you need help quitting, ask your health care provider.  Eat at least 5 servings of fruits and vegetables every day.  Lose weight if you are overweight. Why are these changes important?  These changes and screening tests are designed to address the factors that are known to increase the risk of cervical cancer. Taking these steps is the best way to reduce your risk.  Having regular Pap tests will help identify changes in cells that could lead to cancer. Steps can then be taken to prevent cancer from developing.  These changes will also help find cervical cancer early. This type of cancer can be treated effectively if it is found early. It can be more dangerous and difficult to treat if cancer has grown deep into your cervix or has spread.  In addition to making you less likely to get cervical cancer, these changes will also provide other health benefits, such as the following: ? Practicing safe  sex is important for preventing STIs and unplanned pregnancies. ? Avoiding tobacco can reduce your risk for other cancers and health issues. ? Eating a healthy diet and maintaining a healthy weight are good for your overall health. What can happen if changes are not made? In the early stages, cervical cancer might not have any symptoms. It can take many years for the cancer to grow and get deep into the cervix tissue. This may be happening without you knowing about it. If you develop any symptoms, such as pelvic pain or unusual discharge or bleeding from your vagina, you should see your health care provider right away. If cervical cancer is not found early, you might need treatments such as radiation, chemotherapy, or surgery. In some cases, surgery may mean that you will not be able to get pregnant or carry a pregnancy to term. Where to find support: Talk with your  health care provider, school nurse, or local health department for guidance about screening and vaccination. Some children and teens may be able to get the HPV vaccine free of charge through the U.S. government's Vaccines for Children [facility]) program. Other places that provide vaccinations include:  Public health clinics. Check with your local health department.  Federally [employer], where you would pay only what you can afford. To find one near you, check this website: http://weiss.info/  Rural Health Clinics. These are part of a program for Medicare and Medicaid patients who live in rural areas.  The National Breast and Cervical Cancer Early Detection Program also provides breast and cervical cancer screenings and diagnostic services to low-income, uninsured, and underinsured women. Cervical cancer can be passed down through families. Talk with your health care provider or genetic counselor to learn more about genetic testing for cancer. Where to find more information: Learn more about cervical cancer from:  [employer]: HardChecks.be  American Cancer Society: www.cancer.org/cancer/cervicalcancer/  U.S. Centers for Disease Control and Prevention: MedicationWarning.tn  Summary  Talk with your health care provider about getting the HPV vaccine.  Be sure to get regular Pap tests as recommended by your health care provider.  See your health care provider right away if you have any pelvic pain or unusual discharge or bleeding from your vagina. This information is not intended to replace advice given to you by your health care provider. Make sure you discuss any questions you have with your health care provider. Document Released: 10/14/2015 Document Revised: 05/27/2016 Document Reviewed: 05/27/2016 Elsevier Interactive Patient Education  [employer].

## 2018-09-03 LAB — CYTOLOGY - PAP
ADEQUACY: ABSENT
DIAGNOSIS: NEGATIVE

## 2018-09-12 ENCOUNTER — Encounter: Payer: Self-pay | Admitting: Physician Assistant

## 2018-09-22 ENCOUNTER — Ambulatory Visit
Admission: RE | Admit: 2018-09-22 | Discharge: 2018-09-22 | Disposition: A | Discharge status: Home / Self Care | Payer: No Typology Code available for payment source | Source: Ambulatory Visit | Attending: Physician Assistant | Admitting: Physician Assistant

## 2018-09-22 DIAGNOSIS — N6310 Unspecified lump in the right breast, unspecified quadrant: Secondary | ICD-10-CM

## 2018-10-28 ENCOUNTER — Telehealth: Payer: No Typology Code available for payment source | Admitting: Physician Assistant

## 2018-10-28 DIAGNOSIS — R300 Dysuria: Secondary | ICD-10-CM

## 2018-10-28 MED ORDER — CEPHALEXIN 500 MG PO CAPS: 500.0000 mg | ORAL_CAPSULE | Freq: Two times a day (BID) | ORAL | 0 refills | Status: AC

## 2018-10-28 NOTE — Progress Notes (Signed)

## 2019-06-29 ENCOUNTER — Telehealth: Payer: No Typology Code available for payment source | Admitting: Family

## 2019-06-29 DIAGNOSIS — J358 Other chronic diseases of tonsils and adenoids: Secondary | ICD-10-CM | POA: Diagnosis not present

## 2019-06-29 NOTE — Progress Notes (Signed)
We are sorry that you are not feeling well.  Here is how we plan to help!  Based on your pictures, it looks like you have tonsil stones. These are particles of food and things that get trapped into the tonsils. These are self limiting, and you can gargle with mouth wash or warm salt water to help break them loose.If you develop sore throat or your symptoms worsen please let us know.  After careful review of your answers, I would not recommend and antibiotic for your condition.  Antibiotics should not be used to treat conditions that we suspect are caused by viruses like the virus that causes the common cold or flu. However, some people can have Strep with atypical symptoms. You may need formal testing in clinic or office to confirm if your symptoms continue or worsen.  Providers prescribe antibiotics to treat infections caused by bacteria. Antibiotics are very powerful in treating bacterial infections when they are used properly.  To maintain their effectiveness, they should be used only when necessary.  Overuse of antibiotics has resulted in the development of super bugs that are resistant to treatment!    Home Care:  Only take medications as instructed by your medical team.  Do not drink alcohol while taking these medications.  A steam or ultrasonic humidifier can help congestion.  You can place a towel over your head and breathe in the steam from hot water coming from a faucet.  Avoid close contacts especially the very young and the elderly.  Cover your mouth when you cough or sneeze.  Always remember to wash your hands.  Get Help Right Away If:  You develop worsening fever or throat pain.  You develop a severe head ache or visual changes.  Your symptoms persist after you have completed your treatment plan.  Make sure you  Understand these instructions.  Will watch your condition.  Will get help right away if you are not doing well or get worse.  Your e-visit answers were  reviewed by a board certified advanced clinical practitioner to complete your personal care plan.  Depending on the condition, your plan could have included both over the counter or prescription medications.  If there is a problem please reply  once you have received a response from your provider.  Your safety is important to [place].  If you have drug allergies check your prescription carefully.    You can use MyChart to ask questions about todays visit, request a non-urgent call back, or ask for a work or school excuse for 24 hours related to this e-Visit. If it has been greater than 24 hours you will need to follow up with your provider, or enter a new e-Visit to address those concerns.  You will get an e-mail in the next two days asking about your experience.  I hope that your e-visit has been valuable and will speed your recovery. Thank you for using e-visits.

## 2019-07-18 ENCOUNTER — Encounter: Payer: Self-pay | Admitting: Physician Assistant

## 2019-07-19 ENCOUNTER — Ambulatory Visit (INDEPENDENT_AMBULATORY_CARE_PROVIDER_SITE_OTHER): Payer: No Typology Code available for payment source | Admitting: Osteopathic Medicine

## 2019-07-19 ENCOUNTER — Encounter: Payer: Self-pay | Admitting: Osteopathic Medicine

## 2019-07-19 VITALS — BP 127/74 | HR 96 | Temp 98.4°F | Wt 120.0 lb

## 2019-07-19 DIAGNOSIS — K6289 Other specified diseases of anus and rectum: Secondary | ICD-10-CM

## 2019-07-19 MED ORDER — NITROGLYCERIN 0.4 % RE OINT: 1.0000 [in_us] | TOPICAL_OINTMENT | Freq: Two times a day (BID) | RECTAL | 1 refills | Status: DC | PRN

## 2019-07-19 MED ORDER — LIDOCAINE 5 % EX OINT: 1.0000 "application " | TOPICAL_OINTMENT | Freq: Four times a day (QID) | CUTANEOUS | 1 refills | Status: DC | PRN

## 2019-07-19 NOTE — Progress Notes (Signed)
Virtual Visit via Video (App used: Doximity) Note  I connected with      Patricia Swanson on 07/19/19 at 10:13 AM by a telemedicine application and verified that I am speaking with the correct person using two identifiers.  Patient is in her car / at work  I am in office    I discussed the limitations of evaluation and management by telemedicine and the availability of in person appointments. The patient expressed understanding and agreed to proceed.  History of Present Illness: Patricia Swanson is a 28 y.o. female who would like to discuss blood in stool      . Location: rectal . Quality/TIming: tearing pain w/ every defecation, bright red blood on stool.  . Duration: ongoing since early 05/2019 . Context: went under anesthesia for wisdom tooth removal which caused some constipation  That seemed to recede all this  . Modifying factors: OTC hemorrhoid medications not helping. Stool softener  . Assoc signs/symptoms:     Observations/Objective: BP 127/74 (Patient Position: Sitting, Cuff Size: Normal)   Pulse 96   Temp 98.4 F (36.9 C) (Oral)   Wt 120 lb (54.4 kg)   BMI 19.37 kg/m  BP Readings from Last 3 Encounters:  07/19/19 127/74  09/02/18 138/79  04/07/18 119/74   Exam: Normal Speech.  NAD  Lab and Radiology Results No results found for this or any previous visit (from the past 72 hour(s)). No results found.     Assessment and Plan: 28 y.o. female with The encounter diagnosis was Rectal pain.   PDMP not reviewed this encounter. No orders of the defined types were placed in this encounter.  Meds ordered this encounter  Medications  . Nitroglycerin 0.4 % OINT    Sig: Place 1 inch rectally every 12 (twelve) hours as needed (for anal fissure pain, use up to 3 weeks.).    Dispense:  30 g    Refill:  1  . lidocaine (XYLOCAINE) 5 % ointment    Sig: Apply 1 application topically 4 (four) times daily as needed. Rectal pain (numbing medication)     Dispense:  30 g    Refill:  1   Patient Instructions  Without exam, difficult to say, but sounds like possible internal hemorrhoid or anal fissure. Sent Rx to pharmacy: numbing lidocaine for severe pain, topical nitroglycerin for treatment of possible fissure. Would recommend fiber supplementation and increased hydration to prevent constipation and to soften stool.    Instructions sent via MyChart. If MyChart not available, pt was given option for info via personal e-mail w/ no guarantee of protected health info over unsecured e-mail communication, and MyChart sign-up instructions were included.   Follow Up Instructions: Return if symptoms worsen or fail to improve.    I discussed the assessment and treatment plan with the patient. The patient was provided an opportunity to ask questions and all were answered. The patient agreed with the plan and demonstrated an understanding of the instructions.   The patient was advised to call back or seek an in-person evaluation if any new concerns, if symptoms worsen or if the condition fails to improve as anticipated.  25 minutes of non-face-to-face time was provided during this encounter.                      Historical information moved to improve visibility of documentation.  No past medical history on file. Past Surgical History:  Procedure Laterality Date  . NO PAST  SURGERIES     Social History   Tobacco Use  . Smoking status: Never Smoker  . Smokeless tobacco: Never Used  Substance Use Topics  . Alcohol use: Not Currently    Alcohol/week: 1.0 standard drinks    Types: 1 Standard drinks or equivalent per week   family history includes Diabetes in her maternal grandmother and paternal grandmother.  Medications: Current Outpatient Medications  Medication Sig Dispense Refill  . lidocaine (XYLOCAINE) 5 % ointment Apply 1 application topically 4 (four) times daily as needed. Rectal pain (numbing medication) 30 g 1   . Nitroglycerin 0.4 % OINT Place 1 inch rectally every 12 (twelve) hours as needed (for anal fissure pain, use up to 3 weeks.). 30 g 1   No current facility-administered medications for this visit.    No Known Allergies  PDMP not reviewed this encounter. No orders of the defined types were placed in this encounter.  Meds ordered this encounter  Medications  . Nitroglycerin 0.4 % OINT    Sig: Place 1 inch rectally every 12 (twelve) hours as needed (for anal fissure pain, use up to 3 weeks.).    Dispense:  30 g    Refill:  1  . lidocaine (XYLOCAINE) 5 % ointment    Sig: Apply 1 application topically 4 (four) times daily as needed. Rectal pain (numbing medication)    Dispense:  30 g    Refill:  1

## 2019-07-19 NOTE — Patient Instructions (Signed)
Without exam, difficult to say, but sounds like possible internal hemorrhoid or anal fissure. Sent Rx to pharmacy: numbing lidocaine for severe pain, topical nitroglycerin for treatment of possible fissure. Would recommend fiber supplementation and increased hydration to prevent constipation and to soften stool.

## 2019-07-25 ENCOUNTER — Ambulatory Visit: Payer: No Typology Code available for payment source | Admitting: Osteopathic Medicine

## 2019-08-17 ENCOUNTER — Other Ambulatory Visit: Payer: Self-pay | Admitting: Family

## 2019-08-17 NOTE — Progress Notes (Signed)
Approximately 5 minutes was spent documenting and reviewing patient's chart.   

## 2019-09-06 ENCOUNTER — Ambulatory Visit: Payer: No Typology Code available for payment source | Admitting: Osteopathic Medicine

## 2019-09-27 ENCOUNTER — Encounter: Payer: Self-pay | Admitting: Osteopathic Medicine

## 2020-02-16 ENCOUNTER — Encounter: Payer: Self-pay | Admitting: Osteopathic Medicine

## 2020-02-20 ENCOUNTER — Encounter: Payer: Self-pay | Admitting: Medical-Surgical

## 2020-02-20 ENCOUNTER — Other Ambulatory Visit: Payer: Self-pay

## 2020-02-20 ENCOUNTER — Ambulatory Visit (INDEPENDENT_AMBULATORY_CARE_PROVIDER_SITE_OTHER): Payer: No Typology Code available for payment source | Admitting: Medical-Surgical

## 2020-02-20 VITALS — BP 117/76 | HR 107 | Temp 98.4°F | Ht 66.0 in | Wt 121.4 lb

## 2020-02-20 DIAGNOSIS — N912 Amenorrhea, unspecified: Secondary | ICD-10-CM | POA: Diagnosis not present

## 2020-02-20 DIAGNOSIS — Z3201 Encounter for pregnancy test, result positive: Secondary | ICD-10-CM

## 2020-02-20 DIAGNOSIS — Z114 Encounter for screening for human immunodeficiency virus [HIV]: Secondary | ICD-10-CM

## 2020-02-20 LAB — POCT URINE PREGNANCY: Preg Test, Ur: POSITIVE — AB

## 2020-02-20 NOTE — Progress Notes (Signed)
Subjective:    CC: Amenorrhea, positive home pregnancy test  HPI: Very pleasant 29 year old female presenting today for confirmation of positive pregnancy test.  Last menstrual cycle 01/18/2020.  She and her husband have been trying to conceive for approximately 2 months.  Reports she has been having mild headaches and breast tenderness.  Denies nausea.  Reports that she has been trying to cut back on caffeine.  Has not taken any OTC medication as she was not sure what was safe.  I reviewed the past medical history, family history, social history, surgical history, and allergies today and no changes were needed.  Please see the problem list section below in epic for further details.  Past Medical History: History reviewed. No pertinent past medical history. Past Surgical History: Past Surgical History:  Procedure Laterality Date  . NO PAST SURGERIES     Social History: Social History   Socioeconomic History  . Marital status: Married    Spouse name: N/A  . Number of children: 0  . Years of education: Not on file  . Highest education level: Not on file  Occupational History  . Occupation: Student    Comment: [employer]  Tobacco Use  . Smoking status: Never Smoker  . Smokeless tobacco: Never Used  Substance and Sexual Activity  . Alcohol use: Not Currently    Alcohol/week: 1.0 standard drinks    Types: 1 Standard drinks or equivalent per week  . Drug use: Never  . Sexual activity: Yes    Birth control/protection: Condom  Other Topics Concern  . Not on file  Social History Narrative   Studying Biology at [employer].  Hopes to become a Stage manager. Lives off campus with a roommate. Family lives in [place], [place].   Social Determinants of Health   Financial Resource Strain:   . Difficulty of Paying Living Expenses:   Food Insecurity:   . Worried About Charity fundraiser in the Last Year:   . Arboriculturist in the Last Year:   Transportation Needs:   . Lexicographer (Medical):   Marland Kitchen Lack of Transportation (Non-Medical):   Physical Activity:   . Days of Exercise per Week:   . Minutes of Exercise per Session:   Stress:   . Feeling of Stress :   Social Connections:   . Frequency of Communication with Friends and Family:   . Frequency of Social Gatherings with Friends and Family:   . Attends Religious Services:   . Active Member of Clubs or Organizations:   . Attends Archivist Meetings:   Marland Kitchen Marital Status:    Family History: Family History  Problem Relation Age of Onset  . Diabetes Maternal Grandmother   . Diabetes Paternal Grandmother    Allergies: No Known Allergies Medications: See med rec.  Review of Systems: See HPI for pertinent positives and negatives.  Objective:    General: Well Developed, well nourished, and in no acute distress.  Neuro: Alert and oriented x3.  HEENT: Normocephalic, atraumatic.  Skin: Warm and dry. Cardiac: Regular rate and rhythm, no murmurs rubs or gallops, no lower extremity edema.  Respiratory: Clear to auscultation bilaterally. Not using accessory muscles, speaking in full sentences.   Impression and Recommendations:    1. Amenorrhea POCT UPT-positive. - POCT urine pregnancy  2. Positive pregnancy test Referring to OB/GYN-[facility] per patient request.  Okay to take Tylenol but please avoid other OTC medications until cleared by OB/GYN.   - Ambulatory referral to  Obstetrics / Gynecology  Return if symptoms worsen or fail to improve. ___________________________________________ Thayer Ohm, DNP, APRN, FNP-BC Primary Care and Sports Medicine [facility] [place]

## 2020-02-28 ENCOUNTER — Encounter: Payer: No Typology Code available for payment source | Admitting: Obstetrics

## 2020-03-08 ENCOUNTER — Ambulatory Visit (INDEPENDENT_AMBULATORY_CARE_PROVIDER_SITE_OTHER): Payer: No Typology Code available for payment source | Admitting: Advanced Practice Midwife

## 2020-03-08 ENCOUNTER — Encounter: Payer: Self-pay | Admitting: Advanced Practice Midwife

## 2020-03-08 ENCOUNTER — Other Ambulatory Visit: Payer: Self-pay

## 2020-03-08 ENCOUNTER — Other Ambulatory Visit (HOSPITAL_COMMUNITY)
Admission: RE | Admit: 2020-03-08 | Discharge: 2020-03-08 | Disposition: A | Discharge status: Home / Self Care | Payer: No Typology Code available for payment source | Source: Ambulatory Visit | Attending: Advanced Practice Midwife | Admitting: Advanced Practice Midwife

## 2020-03-08 VITALS — BP 110/60 | Wt 122.0 lb

## 2020-03-08 DIAGNOSIS — Z113 Encounter for screening for infections with a predominantly sexual mode of transmission: Secondary | ICD-10-CM

## 2020-03-08 DIAGNOSIS — Z3400 Encounter for supervision of normal first pregnancy, unspecified trimester: Secondary | ICD-10-CM | POA: Insufficient documentation

## 2020-03-08 DIAGNOSIS — Z3401 Encounter for supervision of normal first pregnancy, first trimester: Secondary | ICD-10-CM | POA: Insufficient documentation

## 2020-03-08 LAB — OB RESULTS CONSOLE VARICELLA ZOSTER ANTIBODY, IGG: Varicella: IMMUNE

## 2020-03-08 NOTE — Progress Notes (Signed)
Ready Set Baby Information given today.  

## 2020-03-08 NOTE — Progress Notes (Signed)
New Obstetric Patient H&P    Chief Complaint: "Desires prenatal care"   History of Present Illness: Patient is a 29 y.o. G1P0000 Not Hispanic or Latino female, presents with amenorrhea and positive home pregnancy test. Patient's last menstrual period was 01/18/2020 (exact date). and based on her  LMP, her EDD is Estimated Date of Delivery: 10/24/20 and her EGA is [redacted]w[redacted]d. Cycles are 5. days, regular, and occur approximately every : 28 days. Her last pap smear was 2 years ago and was no abnormalities.    She had a urine pregnancy test which was positive 2 week(s)  ago. Her last menstrual period was normal and lasted for  5 or 6 day(s). Since her LMP she claims she has experienced breast tenderness, fatigue, nausea, vomiting a few times. She denies vaginal bleeding. Her past medical history is noncontributory. This is her first pregnancy. Since her LMP, she admits to the use of tobacco products  no She claims she has gained   no pounds since the start of her pregnancy.  There are cats in the home in the home  yes If yes Indoor She admits close contact with children on a regular basis  no  She has had chicken pox in the past yes She has had Tuberculosis exposures, symptoms, or previously tested positive for TB   no Current or past history of domestic violence. no  Genetic Screening/Teratology Counseling: (Includes patient, baby's father, or anyone in either family with:)   1. Patient's age >/= 62 [facility]  no 2. Thalassemia ([place], [place], Mediterranean, or Asian background): MCV<80  no 3. Neural tube defect (meningomyelocele, spina bifida, anencephaly)  no 4. Congenital heart defect  no  5. Down syndrome  no 6. Tay-Sachs (Jewish, [place]  no 7. Canavan's Disease  no 8. Sickle cell disease or trait (African)  no  9. Hemophilia or other blood disorders  no  10. Muscular dystrophy  no  11. Cystic fibrosis  no  12. Huntington's Chorea  no  13. Mental retardation/autism  no 14. Other  inherited genetic or chromosomal disorder  no 15. Maternal metabolic disorder (DM, PKU, etc)  no 16. Patient or FOB with a child with a birth defect not listed above no  16a. Patient or FOB with a birth defect themselves no 17. Recurrent pregnancy loss, or stillbirth  no  18. Any medications since LMP other than prenatal vitamins (include vitamins, supplements, OTC meds, drugs, alcohol)  no 19. 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 Lives with someone with TB or TB exposed  no  2. Patient or partner has history of genital herpes  no 3. Rash or viral illness since LMP  no 4. History of STI (GC, CT, HPV, syphilis, HIV)  no 5. History of recent travel :  no  Other pertinent information:  no     Review of Systems:10 point review of systems negative unless otherwise noted in HPI  Past Medical History:  Patient Active Problem List   Diagnosis Date Noted  . Encounter for supervision of normal first pregnancy in first trimester 03/08/2020    [facility] P[facility]atal [facility]  Dating  Blood type:     Genetic Screen 1 Screen:    AFP:     Quad:     NIPS: Antibody:   Anatomic [place]  Rubella:   Varicella: @VZVIGG @  GTT Early:  NA  Third trimester:  RPR:     Rhogam  HBsAg:     Vaccines TDAP:                       Flu Shot: HIV:     Baby Food Breast                               GBS:   Contraception  Pap: 2019 negative  CBB     CS/VBAC NA   Support Person [name] husband       . Breast mass, right, benign 09/02/2018  . Elevated blood pressure reading 09/02/2018    Past Surgical History:  Past Surgical History:  Procedure Laterality Date  . NO PAST SURGERIES      Gynecologic History: Patient's last menstrual period was 01/18/2020 (exact date).  Obstetric History: G1P0000  Family History:  Family History  Problem Relation Age of Onset  .  Diabetes Maternal Grandmother   . Diabetes Paternal Grandmother     Social History:  Social History   Socioeconomic History  . Marital status: Married    Spouse name: N/A  . Number of children: 0  . Years of education: Not on file  . Highest education level: Not on file  Occupational History  . Occupation: Student    Comment: [employer]  Tobacco Use  . Smoking status: Never Smoker  . Smokeless tobacco: Never Used  Substance and Sexual Activity  . Alcohol use: Not Currently    Alcohol/week: 1.0 standard drinks    Types: 1 Standard drinks or equivalent per week  . Drug use: Never  . Sexual activity: Yes    Partners: Male    Birth control/protection: None  Other Topics Concern  . 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  Hopes to become a Marine scientist. Lives off campus with a roommate. Family lives in [place], [place].   Social Determinants of Health   Financial Resource Strain:   . Difficulty of Paying Living Expenses:   Food Insecurity:   . Worried About Programme researcher, broadcasting/film/video in the Last Year:   . Barista in the Last Year:   Transportation Needs:   . Freight forwarder (Medical):   Marland Kitchen Lack of Transportation (Non-Medical):   Physical Activity:   . Days of Exercise per Week:   . Minutes of Exercise per Session:   Stress:   . Feeling of Stress :   Social Connections:   . Frequency of Communication with Friends and Family:   . Frequency of Social Gatherings with Friends and Family:   . Attends Religious Services:   . Active Member of Clubs or Organizations:   . Attends Banker Meetings:   Marland Kitchen Marital Status:   Intimate Partner Violence:   . Fear of Current or Ex-Partner:   . Emotionally Abused:   Marland Kitchen Physically Abused:   . Sexually Abused:     Allergies:  No Known Allergies  Medications: Prior to Admission medications   Medication Sig Start Date End Date Taking? Authorizing Provider  Prenatal Vit-Fe Fumarate-FA  (PRENATAL VITAMIN PO) Take 1 tablet by mouth daily.   Yes [provider]    Physical Exam Vitals: Blood pressure 110/60, weight 122 lb (55.3 kg), last menstrual period 01/18/2020.  General: NAD HEENT: normocephalic, anicteric Thyroid: no enlargement, no palpable nodules Pulmonary: No increased work of breathing, CTAB  Cardiovascular: RRR, distal pulses 2+ Abdomen: NABS, soft, non-tender, non-distended.  Umbilicus without lesions.  No hepatomegaly, splenomegaly or masses palpable. No evidence of hernia  Genitourinary:  External: Normal external female genitalia.  Normal urethral meatus, normal  Bartholin's and Skene's glands.    Vagina: Normal vaginal mucosa, no evidence of prolapse.    Cervix: not visualized- swab only for Aptima, no bleeding  Uterus:  Non-enlarged, mobile, normal contour.    Adnexa: ovaries non-enlarged, no adnexal masses  Rectal: deferred Extremities: no edema, erythema, or tenderness Neurologic: Grossly intact Psychiatric: mood appropriate, affect full   Patient plans to breastfeed The following were addressed during this visit:  Breastfeeding Education - Early initiation of breastfeeding    Comments: Keeps milk supply adequate, helps contract uterus and slow bleeding, and early milk is the perfect first food and is easy to digest.   - The importance of exclusive breastfeeding    Comments: Provides antibodies, Lower risk of breast and ovarian cancers, and type-2 diabetes,Helps your body recover, Reduced chance of SIDS.   - Risks of giving your baby anything other than breast milk if you are breastfeeding    Comments: Make the baby less content with breastfeeds, may make my baby more susceptible to illness, and may reduce my milk supply.   - The importance of early skin-to-skin contact    Comments: Keeps baby warm and secure, helps keep baby's blood sugar up and breathing steady, easier to bond and breastfeed, and helps calm baby.  - Rooming-in  on a 24-hour basis    Comments: Easier to learn baby's feeding cues, easier to bond and get to know each other, and encourages milk production.   - Feeding on demand or baby-led feeding    Comments: Helps prevent breastfeeding complications, helps bring in good milk supply, prevents under or overfeeding, and helps baby feel content and satisfied   - Frequent feeding to help assure optimal milk production    Comments: Making a full supply of milk requires frequent removal of milk from breasts, infant will eat 8-12 times in 24 hours, if separated from infant use breast massage, hand expression and/ or pumping to remove milk from breasts.   - Effective positioning and attachment    Comments: Helps my baby to get enough breast milk, helps to produce an adequate milk supply, and helps prevent nipple pain and damage   - Exclusive breastfeeding for the first 6 months    Comments: Builds a healthy milk supply and keeps it up, protects baby from sickness and disease, and breastmilk has everything your baby needs for the first 6 months.    Assessment: 29 y.o. G1P0000 at [redacted]w[redacted]d by exact LMP presenting to initiate prenatal care  Plan: 1) Avoid alcoholic beverages. 2) Patient encouraged not to smoke.  3) Discontinue the use of all non-medicinal drugs and chemicals.  4) Take prenatal vitamins daily.  5) Nutrition, food safety (fish, cheese advisories, and high nitrite foods) and exercise discussed. 6) Hospital and practice style discussed with cross coverage system.  7) Genetic Screening, such as with 1st Trimester Screening, cell free fetal DNA, AFP testing, and Ultrasound, as well as with amniocentesis and CVS as appropriate, is discussed with patient. At the conclusion of today's visit patient requested genetic testing- first and second trimester testing 8) Patient is asked about travel to areas at risk for the [place] virus, and counseled to avoid travel and exposure to mosquitoes or sexual partners  who may have themselves been exposed to the  virus. Testing is discussed, and will be ordered as appropriate.  9) Aptima, urine culture, NOB panel today 10) Return to clinic in 1 week for dating scan and rob 88) MaterniT 21 at 10+ weeks   Tresea Mall, CNM [facility] 03/08/2020, 3:20 PM

## 2020-03-08 NOTE — Patient Instructions (Addendum)
Exercise During Pregnancy Exercise is an important part of being healthy for people of all ages. Exercise improves the function of your heart and lungs and helps you maintain strength, flexibility, and a healthy body weight. Exercise also boosts energy levels and elevates mood. Most women should exercise regularly during pregnancy. In rare cases, women with certain medical conditions or complications may be asked to limit or avoid exercise during pregnancy. How does this affect me? Along with maintaining general strength and flexibility, exercising during pregnancy can help:  Keep strength in muscles that are used during labor and childbirth.  Decrease low back pain.  Reduce symptoms of depression.  Control weight gain during pregnancy.  Reduce the risk of needing insulin if you develop diabetes during pregnancy.  Decrease the risk of cesarean delivery.  Speed up your recovery after giving birth. How does this affect my baby? Exercise can help you have a healthy pregnancy. Exercise does not cause premature birth. It will not cause your baby to weigh less at birth. What exercises can I do? Many exercises are safe for you to do during pregnancy. Do a variety of exercises that safely increase your heart and breathing rates and help you build and maintain muscle strength. Do exercises exactly as told by your health care provider. You may do these exercises:  Walking or hiking.  Swimming.  Water aerobics.  Riding a stationary bike.  Strength training.  Modified yoga or Pilates. Tell your instructor that you are pregnant. Avoid overstretching, and avoid lying on your back for long periods of time.  Running or jogging. Only choose this type of exercise if you: ? Ran or jogged regularly before your pregnancy. ? Can run or jog and still talk in complete sentences. What exercises should I avoid? Depending on your level of fitness and whether you exercised regularly before your  pregnancy, you may be told to limit high-intensity exercise. You can tell that you are exercising at a high intensity if you are breathing much harder and faster and cannot hold a conversation while exercising. You must avoid:  Contact sports.  Activities that put you at risk for falling on or being hit in the belly, such as downhill skiing, water skiing, surfing, rock climbing, cycling, gymnastics, and horseback riding.  Scuba diving.  Skydiving.  Yoga or Pilates in a room that is heated to high temperatures.  Jogging or running, unless you ran or jogged regularly before your pregnancy. While jogging or running, you should always be able to talk in full sentences. Do not run or jog so fast that you are unable to have a conversation.  Do not exercise at more than 6,000 feet above sea level (high elevation) if you are not used to exercising at high elevation. How do I exercise in a safe way?   Avoid overheating. Do not exercise in very high temperatures.  Wear loose-fitting, breathable clothes.  Avoid dehydration. Drink enough water before, during, and after exercise to keep your urine pale yellow.  Avoid overstretching. Because of hormone changes during pregnancy, it is easy to overstretch muscles, tendons, and ligaments during pregnancy.  Start slowly and ask your health care provider to recommend the types of exercise that are safe for you.  Do not exercise to lose weight. Follow these instructions at home:  Exercise on most days or all days of the week. Try to exercise for 30 minutes a day, 5 days a week, unless your health care provider tells you not to.  If   you actively exercised before your pregnancy and you are healthy, your health care provider may tell you to continue to do moderate to high-intensity exercise.  If you are just starting to exercise or did not exercise much before your pregnancy, your health care provider may tell you to do low to moderate-intensity  exercise. Questions to ask your health care provider  Is exercise safe for me?  What are signs that I should stop exercising?  Does my health condition mean that I should not exercise during pregnancy?  When should I avoid exercising during pregnancy? Stop exercising and contact a health care provider if: You have any unusual symptoms, such as:  Mild contractions of the uterus or cramps in the abdomen.  Dizziness that does not go away when you rest. Stop exercising and get help right away if: You have any unusual symptoms, such as:  Sudden, severe pain in your low back or your belly.  Mild contractions of the uterus or cramps in the abdomen that do not improve with rest and drinking fluids.  Chest pain.  Bleeding or fluid leaking from your vagina.  Shortness of breath. These symptoms may represent a serious problem that is an emergency. Do not wait to see if the symptoms will go away. Get medical help right away. Call your local emergency services (911 in the [place] Do not drive yourself to the hospital. Summary  Most women should exercise regularly throughout pregnancy. In rare cases, women with certain medical conditions or complications may be asked to limit or avoid exercise during pregnancy.  Do not exercise to lose weight during pregnancy.  Your health care provider will tell you what level of physical activity is right for you.  Stop exercising and contact a health care provider if you have mild contractions of the uterus or cramps in the abdomen. Get help right away if these contractions or cramps do not improve with rest and drinking fluids.  Stop exercising and get help right away if you have sudden, severe pain in your low back or belly, chest pain, shortness of breath, or bleeding or leaking of fluid from your vagina. This information is not intended to replace advice given to you by your health care provider. Make sure you discuss any questions you have with your  health care provider. Document Revised: 01/20/2019 Document Reviewed: 11/03/2018 Elsevier Patient Education  2020 [employer] Eating Plan for Pregnant Women While you are pregnant, your body requires additional nutrition to help support your growing baby. You also have a higher need for some vitamins and minerals, such as folic acid, calcium, iron, and vitamin D. Eating a healthy, well-balanced diet is very important for your health and your baby's health. Your need for extra calories varies for the three 3-month segments of your pregnancy (trimesters). For most women, it is recommended to consume:  150 extra calories a day during the first trimester.  300 extra calories a day during the second trimester.  300 extra calories a day during the third trimester. What are tips for following this plan?   Do not try to lose weight or go on a diet during pregnancy.  Limit your overall intake of foods that have "empty calories." These are foods that have little nutritional value, such as sweets, desserts, candies, and sugar-sweetened beverages.  Eat a variety of foods (especially fruits and vegetables) to get a full range of vitamins and minerals.  Take a prenatal vitamin to help meet your additional vitamin and mineral needs   during pregnancy, specifically for folic acid, iron, calcium, and vitamin D.  Remember to stay active. Ask your health care provider what types of exercise and activities are safe for you.  Practice good food safety and cleanliness. Wash your hands before you eat and after you prepare raw meat. Wash all fruits and vegetables well before peeling or eating. Taking these actions can help to prevent food-borne illnesses that can be very dangerous to your baby, such as listeriosis. Ask your health care provider for more information about listeriosis. What does 150 extra calories look like? Healthy options that provide 150 extra calories each day could be any of the  following:  6-8 oz (170-230 g) of plain low-fat yogurt with  cup of berries.  1 apple with 2 teaspoons (11 g) of peanut butter.  Cut-up vegetables with  cup (60 g) of hummus.  8 oz (230 mL) or 1 cup of low-fat chocolate milk.  1 stick of string cheese with 1 medium orange.  1 peanut butter and jelly sandwich that is made with one slice of whole-wheat bread and 1 tsp (5 g) of peanut butter. For 300 extra calories, you could eat two of those healthy options each day. What is a healthy amount of weight to gain? The right amount of weight gain for you is based on your BMI before you became pregnant. If your BMI:  Was less than 18 (underweight), you should gain 28-40 lb (13-18 kg).  Was 18-24.9 (normal), you should gain 25-35 lb (11-16 kg).  Was 25-29.9 (overweight), you should gain 15-25 lb (7-11 kg).  Was 30 or greater (obese), you should gain 11-20 lb (5-9 kg). What if I am having twins or multiples? Generally, if you are carrying twins or multiples:  You may need to eat 300-600 extra calories a day.  The recommended range for total weight gain is 25-54 lb (11-25 kg), depending on your BMI before pregnancy.  Talk with your health care provider to find out about nutritional needs, weight gain, and exercise that is right for you. What foods can I eat?  Fruits All fruits. Eat a variety of colors and types of fruit. Remember to wash your fruits well before peeling or eating. Vegetables All vegetables. Eat a variety of colors and types of vegetables. Remember to wash your vegetables well before peeling or eating. Grains All grains. Choose whole grains, such as whole-wheat bread, oatmeal, or brown rice. Meats and other protein foods Lean meats, including chicken, turkey, fish, and lean cuts of beef, veal, or pork. If you eat fish or seafood, choose options that are higher in omega-3 fatty acids and lower in mercury, such as salmon, herring, mussels, trout, sardines, pollock,  shrimp, crab, and lobster. Tofu. Tempeh. Beans. Eggs. Peanut butter and other nut butters. Make sure that all meats, poultry, and eggs are cooked to food-safe temperatures or "well-done." Two or more servings of fish are recommended each week in order to get the most benefits from omega-3 fatty acids that are found in seafood. Choose fish that are lower in mercury. You can find more information online:  www.fda.gov Dairy Pasteurized milk and milk alternatives (such as almond milk). Pasteurized yogurt and pasteurized cheese. Cottage cheese. Sour cream. Beverages Water. Juices that contain 100% fruit juice or vegetable juice. Caffeine-free teas and decaffeinated coffee. Drinks that contain caffeine are okay to drink, but it is better to avoid caffeine. Keep your total caffeine intake to less than 200 mg each day (which is 12 oz   or 355 mL of coffee, tea, or soda) or the limit as told by your health care provider. Fats and oils Fats and oils are okay to include in moderation. Sweets and desserts Sweets and desserts are okay to include in moderation. Seasoning and other foods All pasteurized condiments. The items listed above may not be a complete list of foods and beverages you can eat. Contact a dietitian for more information. What foods are not recommended? Fruits Unpasteurized fruit juices. Vegetables Raw (unpasteurized) vegetable juices. Meats and other protein foods Lunch meats, bologna, hot dogs, or other deli meats. (If you must eat those meats, reheat them until they are steaming hot.) Refrigerated pat, meat spreads from a meat counter, smoked seafood that is found in the refrigerated section of a store. Raw or undercooked meats, poultry, and eggs. Raw fish, such as sushi or sashimi. Fish that have high mercury content, such as tilefish, shark, swordfish, and king mackerel. To learn more about mercury in fish, talk with your health care provider or look for online resources, such  as:  www.fda.gov Dairy Raw (unpasteurized) milk and any foods that have raw milk in them. Soft cheeses, such as feta, queso blanco, queso fresco, Brie, Camembert cheeses, blue-veined cheeses, and Panela cheese (unless it is made with pasteurized milk, which must be stated on the label). Beverages Alcohol. Sugar-sweetened beverages, such as sodas, teas, or energy drinks. Seasoning and other foods Homemade fermented foods and drinks, such as pickles, sauerkraut, or kombucha drinks. (Store-bought pasteurized versions of these are okay.) Salads that are made in a store or deli, such as ham salad, chicken salad, egg salad, tuna salad, and seafood salad. The items listed above may not be a complete list of foods and beverages you should avoid. Contact a dietitian for more information. Where to find more information To calculate the number of calories you need based on your height, weight, and activity level, you can use an online calculator such as:  www.choosemyplate.gov/MyPlatePlan To calculate how much weight you should gain during pregnancy, you can use an online pregnancy weight gain calculator such as:  www.choosemyplate.gov/pregnancy-weight-gain-calculator Summary  While you are pregnant, your body requires additional nutrition to help support your growing baby.  Eat a variety of foods, especially fruits and vegetables to get a full range of vitamins and minerals.  Practice good food safety and cleanliness. Wash your hands before you eat and after you prepare raw meat. Wash all fruits and vegetables well before peeling or eating. Taking these actions can help to prevent food-borne illnesses, such as listeriosis, that can be very dangerous to your baby.  Do not eat raw meat or fish. Do not eat fish that have high mercury content, such as tilefish, shark, swordfish, and king mackerel. Do not eat unpasteurized (raw) dairy.  Take a prenatal vitamin to help meet your additional vitamin and  mineral needs during pregnancy, specifically for folic acid, iron, calcium, and vitamin D. This information is not intended to replace advice given to you by your health care provider. Make sure you discuss any questions you have with your health care provider. Document Revised: 02/17/2019 Document Reviewed: 06/26/2017 Elsevier Patient Education  2020 [employer] Inc. Prenatal Care Prenatal care is health care during pregnancy. It helps you and your unborn baby (fetus) stay as healthy as possible. Prenatal care may be provided by a midwife, a family practice health care provider, or a childbirth and pregnancy specialist (obstetrician). How does this affect me? During pregnancy, you will be closely monitored   for any new conditions that might develop. To lower your risk of pregnancy complications, you and your health care provider will talk about any underlying conditions you have. How does this affect my baby? Early and consistent prenatal care increases the chance that your baby will be healthy during pregnancy. Prenatal care lowers the risk that your baby will be:  Born early (prematurely).  Smaller than expected at birth (small for gestational age). What can I expect at the first prenatal care visit? Your first prenatal care visit will likely be the longest. You should schedule your first prenatal care visit as soon as you know that you are pregnant. Your first visit is a good time to talk about any questions or concerns you have about pregnancy. At your visit, you and your health care provider will talk about:  Your medical history, including: ? Any past pregnancies. ? Your family's medical history. ? The baby's father's medical history. ? Any long-term (chronic) health conditions you have and how you manage them. ? Any surgeries or procedures you have had. ? Any current over-the-counter or prescription medicines, herbs, or supplements you are taking.  Other factors that could pose a risk  to your baby, including:  Your home setting and your stress levels, including: ? Exposure to abuse or violence. ? Household financial strain. ? Mental health conditions you have.  Your daily health habits, including diet and exercise. Your health care provider will also:  Measure your weight, height, and blood pressure.  Do a physical exam, including a pelvic and breast exam.  Perform blood tests and urine tests to check for: ? Urinary tract infection. ? Sexually transmitted infections (STIs). ? Low iron levels in your blood (anemia). ? Blood type and certain proteins on red blood cells (Rh antibodies). ? Infections and immunity to viruses, such as hepatitis B and rubella. ? HIV (human immunodeficiency virus).  Do an ultrasound to confirm your baby's growth and development and to help predict your estimated due date (EDD). This ultrasound is done with a probe that is inserted into the vagina (transvaginal ultrasound).  Discuss your options for genetic screening.  Give you information about how to keep yourself and your baby healthy, including: ? Nutrition and taking vitamins. ? Physical activity. ? How to manage pregnancy symptoms such as nausea and vomiting (morning sickness). ? Infections and substances that may be harmful to your baby and how to avoid them. ? Food safety. ? Dental care. ? Working. ? Travel. ? Warning signs to watch for and when to call your health care provider. How often will I have prenatal care visits? After your first prenatal care visit, you will have regular visits throughout your pregnancy. The visit schedule is often as follows:  Up to week 28 of pregnancy: once every 4 weeks.  28-36 weeks: once every 2 weeks.  After 36 weeks: every week until delivery. Some women may have visits more or less often depending on any underlying health conditions and the health of the baby. Keep all follow-up and prenatal care visits as told by your health care  provider. This is important. What happens during routine prenatal care visits? Your health care provider will:  Measure your weight and blood pressure.  Check for fetal heart sounds.  Measure the height of your uterus in your abdomen (fundal height). This may be measured starting around week 20 of pregnancy.  Check the position of your baby inside your uterus.  Ask questions about your diet, sleeping patterns, and   whether you can feel the baby move.  Review warning signs to watch for and signs of labor.  Ask about any pregnancy symptoms you are having and how you are dealing with them. Symptoms may include: ? Headaches. ? Nausea and vomiting. ? Vaginal discharge. ? Swelling. ? Fatigue. ? Constipation. ? Any discomfort, including back or pelvic pain. Make a list of questions to ask your health care provider at your routine visits. What tests might I have during prenatal care visits? You may have blood, urine, and imaging tests throughout your pregnancy, such as:  Urine tests to check for glucose, protein, or signs of infection.  Glucose tests to check for a form of diabetes that can develop during pregnancy (gestational diabetes mellitus). This is usually done around week 24 of pregnancy.  An ultrasound to check your baby's growth and development and to check for birth defects. This is usually done around week 20 of pregnancy.  A test to check for group B strep (GBS) infection. This is usually done around week 36 of pregnancy.  Genetic testing. This may include blood or imaging tests, such as an ultrasound. Some genetic tests are done during the first trimester and some are done during the second trimester. What else can I expect during prenatal care visits? Your health care provider may recommend getting certain vaccines during pregnancy. These may include:  A yearly flu shot (annual influenza vaccine). This is especially important if you will be pregnant during flu  season.  Tdap (tetanus, diphtheria, pertussis) vaccine. Getting this vaccine during pregnancy can protect your baby from whooping cough (pertussis) after birth. This vaccine may be recommended between weeks 27 and 36 of pregnancy. Later in your pregnancy, your health care provider may give you information about:  Childbirth and breastfeeding classes.  Choosing a health care provider for your baby.  Umbilical cord banking.  Breastfeeding.  Birth control after your baby is born.  The hospital labor and delivery unit and how to tour it.  Registering at the hospital before you go into labor. Where to find more information  Office on Women's Health: womenshealth.gov  American Pregnancy Association: americanpregnancy.org  March of Dimes: marchofdimes.org Summary  Prenatal care helps you and your baby stay as healthy as possible during pregnancy.  Your first prenatal care visit will most likely be the longest.  You will have visits and tests throughout your pregnancy to monitor your health and your baby's health.  Bring a list of questions to your visits to ask your health care provider.  Make sure to keep all follow-up and prenatal care visits with your health care provider. This information is not intended to replace advice given to you by your health care provider. Make sure you discuss any questions you have with your health care provider. Document Revised: 01/19/2019 Document Reviewed: 09/28/2017 Elsevier Patient Education  2020 [employer] Inc[employer]  

## 2020-03-08 NOTE — Progress Notes (Signed)
NOB- confirmed UPT at PCP, no concerns

## 2020-03-09 LAB — RPR+RH+ABO+RUB AB+AB SCR+CB...
Antibody Screen: NEGATIVE
HIV Screen 4th Generation wRfx: NONREACTIVE
Hematocrit: 35.2 % (ref 34.0–46.6)
Hemoglobin: 11.9 g/dL (ref 11.1–15.9)
Hepatitis B Surface Ag: NEGATIVE
MCH: 30.4 pg (ref 26.6–33.0)
MCHC: 33.8 g/dL (ref 31.5–35.7)
MCV: 90 fL (ref 79–97)
Platelets: 237 10*3/uL (ref 150–450)
RBC: 3.92 x10E6/uL (ref 3.77–5.28)
RDW: 12.6 % (ref 11.7–15.4)
RPR Ser Ql: NONREACTIVE
Rh Factor: POSITIVE
Rubella Antibodies, IGG: 1.39 index (ref >0.99)
Varicella zoster IgG: 645 index (ref >165)
WBC: 7.9 10*3/uL (ref 3.4–10.8)

## 2020-03-10 LAB — URINE CULTURE: Organism ID, Bacteria: NO GROWTH

## 2020-03-13 ENCOUNTER — Encounter: Payer: No Typology Code available for payment source | Admitting: Osteopathic Medicine

## 2020-03-14 LAB — CERVICOVAGINAL ANCILLARY ONLY
Chlamydia: NEGATIVE
Comment: NEGATIVE
Comment: NEGATIVE
Comment: NORMAL
Neisseria Gonorrhea: NEGATIVE
Trichomonas: NEGATIVE

## 2020-03-26 ENCOUNTER — Other Ambulatory Visit: Payer: Self-pay | Admitting: Advanced Practice Midwife

## 2020-03-26 ENCOUNTER — Other Ambulatory Visit: Payer: Self-pay

## 2020-03-26 ENCOUNTER — Encounter: Payer: Self-pay | Admitting: Osteopathic Medicine

## 2020-03-26 ENCOUNTER — Ambulatory Visit (INDEPENDENT_AMBULATORY_CARE_PROVIDER_SITE_OTHER): Payer: No Typology Code available for payment source | Admitting: Certified Nurse Midwife

## 2020-03-26 ENCOUNTER — Ambulatory Visit (INDEPENDENT_AMBULATORY_CARE_PROVIDER_SITE_OTHER): Payer: No Typology Code available for payment source

## 2020-03-26 VITALS — BP 124/60 | Wt 123.0 lb

## 2020-03-26 DIAGNOSIS — Z1379 Encounter for other screening for genetic and chromosomal anomalies: Secondary | ICD-10-CM

## 2020-03-26 DIAGNOSIS — Z3401 Encounter for supervision of normal first pregnancy, first trimester: Secondary | ICD-10-CM | POA: Diagnosis not present

## 2020-03-26 DIAGNOSIS — O99891 Other specified diseases and conditions complicating pregnancy: Secondary | ICD-10-CM

## 2020-03-26 DIAGNOSIS — Z3A09 9 weeks gestation of pregnancy: Secondary | ICD-10-CM

## 2020-03-26 LAB — POCT URINALYSIS DIPSTICK OB
Glucose, UA: NEGATIVE
POC,PROTEIN,UA: NEGATIVE

## 2020-03-26 NOTE — Progress Notes (Signed)
Dating scan today. No complaints 

## 2020-03-26 NOTE — Progress Notes (Signed)
ROB At 9wk5d: Doing well. Some smell aversions, but keeping down most of what she eats. Desires MaterniT 21 testing, but not covered by insurance. Given info on self pay cost. Alos discussed doing first trimester screen/NT.  Dating scan today: CRL 10wk1d confirming EDC. FCA 178 BP 124/60 weight up 1 #.   A: IUP at 9wk5d  P: RTO in 3 weeks NT/ first trimester test and/or MaterniT 21 at that time  Farrel Conners, CNM

## 2020-03-26 NOTE — Telephone Encounter (Signed)
Referral placed, patient has [employer] plan. FYI to PCP

## 2020-03-28 ENCOUNTER — Ambulatory Visit (INDEPENDENT_AMBULATORY_CARE_PROVIDER_SITE_OTHER): Payer: No Typology Code available for payment source | Admitting: Osteopathic Medicine

## 2020-03-28 ENCOUNTER — Encounter: Payer: Self-pay | Admitting: Osteopathic Medicine

## 2020-03-28 ENCOUNTER — Other Ambulatory Visit: Payer: Self-pay

## 2020-03-28 VITALS — BP 101/64 | HR 101 | Temp 98.0°F | Ht 65.0 in | Wt 124.0 lb

## 2020-03-28 DIAGNOSIS — Z3A10 10 weeks gestation of pregnancy: Secondary | ICD-10-CM | POA: Diagnosis not present

## 2020-03-28 DIAGNOSIS — Z0000 Encounter for general adult medical examination without abnormal findings: Secondary | ICD-10-CM | POA: Diagnosis not present

## 2020-03-28 NOTE — Progress Notes (Signed)
Gara Ladonna Vanorder is a 29 y.o. female who presents to  [place] at [facility]  today, 03/28/20, seeking care for the following: . Annual physical      ASSESSMENT & PLAN with other pertinent history/findings:  The encounter diagnosis was Annual physical exam.   Patient Instructions  General Preventive Care  Most recent routine screening labs: will hold off while pregnant.   Blood pressure goal 130/80 or less.   Tobacco: don't!   Alcohol: don't while pregnant!  Exercise: as tolerated to reduce risk of cardiovascular disease and diabetes. Strength training will also prevent osteoporosis.   Mental health: if need for mental health care (medicines, counseling, other), or concerns about moods, please let me know!   Sexual / Reproductive health: will defer to OBGYN for now!   Advanced Directive: Living Will and/or Healthcare Power of Attorney recommended for all adults, regardless of age or health.  Vaccines  Flu vaccine: for almost everyone, every fall.   Shingles vaccine: after age 59.   Pneumonia vaccines: after age 45  Tetanus booster: every 10 years / 3rd trimester of pregnancy  COVID vaccine: THANKS for getting your vaccine! :)  Cancer screenings   Colon cancer screening: for everyone age 67-75. Colonoscopy available for all, many people also qualify for the Cologuard stool test   Breast cancer screening: mammogram at age 7   Cervical cancer screening: Pap every 1 to 5 years depending on age and other risk factors.  Lung cancer screening: not needed for non-smokers Infection screenings  . HIV, Gonorrhea/Chlamydia: they'll check during pregnancy!  . Hepatitis C: recommended once for everyone age 60-75 . TB: certain at-risk populations Other . Bone Density Test: recommended at age 31   Constitutional:  . VSS, see nurse notes . General Appearance: alert, well-developed, well-nourished, NAD Eyes: Marland Kitchen Normal  lids and conjunctive, non-icteric sclera Neck: . No masses, trachea midline . No thyroid enlargement/tenderness/mass appreciated Respiratory: . Normal respiratory effort . No dullness/hyper-resonance to percussion . Breath sounds normal, no wheeze/rhonchi/rales Cardiovascular: . S1/S2 normal, no murmur/rub/gallop auscultated . No lower extremity edema Gastrointestinal: . Nontender, no masses . No hepatomegaly, no splenomegaly . No hernia appreciated Musculoskeletal:  . Gait normal . No clubbing/cyanosis of digits Neurological: . No cranial nerve deficit on limited exam . Motor and sensation intact and symmetric Psychiatric: . Normal judgment/insight . Normal mood and affect   Follow-up instructions: Return in about 1 year (around 03/28/2021) for [place] (call week prior to visit for lab orders).                                           BP 101/64 (BP Location: Right Arm, Patient Position: Sitting)   Pulse (!) 101   Temp 98 F (36.7 C)   Ht 5\' 5"  (1.651 m)   Wt 124 lb (56.2 kg)   LMP 01/18/2020 (Exact Date)   SpO2 95%   BMI 20.63 kg/m   No outpatient medications have been marked as taking for the 03/28/20 encounter (Office Visit) with Emeterio Reeve, DO.    Results for orders placed or performed in visit on 03/26/20 (from the past 72 hour(s))  POC Urinalysis Dipstick OB     Status: Normal   Collection Time: 03/26/20  3:44 PM  Result Value Ref Range   Color, UA     Clarity, UA  Glucose, UA Negative Negative   Bilirubin, UA     Ketones, UA     Spec Grav, UA     Blood, UA     pH, UA     POC,PROTEIN,UA Negative Negative, Trace, Small (1+), Moderate (2+), Large (3+), 4+   Urobilinogen, UA     Nitrite, UA     Leukocytes, UA     Appearance     Odor      US OB Transvaginal  Result Date: 03/26/2020 Patient Name: Devin Foskey DOB: 09/27/1991 MRN: [id number] ULTRASOUND REPORT Location: [facility] Date of  Service: 03/26/2020 Indications:dating Findings: Mason Jim intrauterine pregnancy is visualized with a CRL consistent with [redacted]w[redacted]d gestation, giving an (U/S) EDD of 10/21/2020. The (U/S) EDD is consistent with the clinically established EDD of 10/24/2020. FHR: 178 BPM CRL measurement: 31.7 mm Yolk sac is visualized and appears normal. Amnion: visualized and appears normal Right Ovary is normal in appearance. Left Ovary is normal appearance. Corpus luteal cyst:  Right ovary Survey of the adnexa demonstrates no adnexal masses. There is no free peritoneal fluid in the cul de sac. Impression: 1. [redacted]w[redacted]d Viable Singleton Intrauterine pregnancy by U/S. 2. (U/S) EDD is consistent with Clinically established EDD of 10/24/2020. Recommendations: 1.Clinical correlation with the patient's History and Physical Exam. Deanna Artis, RT There is a viable singleton gestation.  The fetal biometry correlates with established dating. Detailed evaluation of the fetal anatomy is precluded by early gestational age.  It must be noted that a normal ultrasound particular at this early gestational age is unable to rule out fetal aneuploidy, risk of first trimester miscarriage, or anatomic birth defects. Vena Austria, MD, FACOG [facility], [facility] 03/26/2020, 9:34 PM    Depression screen [facility] 2/9 03/28/2020 04/07/2018  Decreased Interest 0 0  Down, Depressed, Hopeless 0 0  PHQ - 2 Score 0 0  Altered sleeping 0 -  Tired, decreased energy 0 -  Change in appetite 0 -  Feeling bad or failure about yourself  0 -  Trouble concentrating 0 -  Moving slowly or fidgety/restless 0 -  Suicidal thoughts 0 -  PHQ-9 Score 0 -    GAD 7 : Generalized Anxiety Score 03/28/2020  Nervous, Anxious, on Edge 0  Control/stop worrying 0  Worry too much - different things 0  Trouble relaxing 0  Restless 0  Easily annoyed or irritable 0  Afraid - awful might happen 0  Total GAD 7 Score 0      All questions at time of  visit were answered - patient instructed to contact office with any additional concerns or updates.  ER/RTC precautions were reviewed with the patient.  Please note: voice recognition software was used to produce this document, and typos may escape review. Please contact Dr. Lyn Hollingshead for any needed clarifications.

## 2020-03-28 NOTE — Patient Instructions (Signed)
General Preventive Care  Most recent routine screening labs: will hold off while pregnant.   Blood pressure goal 130/80 or less.   Tobacco: don't!   Alcohol: don't while pregnant!  Exercise: as tolerated to reduce risk of cardiovascular disease and diabetes. Strength training will also prevent osteoporosis.   Mental health: if need for mental health care (medicines, counseling, other), or concerns about moods, please let me know!   Sexual / Reproductive health: will defer to OBGYN for now!   Advanced Directive: Living Will and/or Healthcare Power of Attorney recommended for all adults, regardless of age or health.  Vaccines  Flu vaccine: for almost everyone, every fall.   Shingles vaccine: after age 69.   Pneumonia vaccines: after age 62  Tetanus booster: every 10 years / 3rd trimester of pregnancy  COVID vaccine: THANKS for getting your vaccine! :)  Cancer screenings   Colon cancer screening: for everyone age 50-75. Colonoscopy available for all, many people also qualify for the Cologuard stool test   Breast cancer screening: mammogram at age 23   Cervical cancer screening: Pap every 1 to 5 years depending on age and other risk factors.  Lung cancer screening: not needed for non-smokers Infection screenings  . HIV, Gonorrhea/Chlamydia: they'll check during pregnancy!  . Hepatitis C: recommended once for everyone age 24-75 . TB: certain at-risk populations Other . Bone Density Test: recommended at age 8

## 2020-04-17 ENCOUNTER — Ambulatory Visit (INDEPENDENT_AMBULATORY_CARE_PROVIDER_SITE_OTHER): Payer: No Typology Code available for payment source | Admitting: Obstetrics and Gynecology

## 2020-04-17 ENCOUNTER — Encounter: Payer: Self-pay | Admitting: Obstetrics and Gynecology

## 2020-04-17 ENCOUNTER — Other Ambulatory Visit: Payer: Self-pay

## 2020-04-17 ENCOUNTER — Ambulatory Visit (INDEPENDENT_AMBULATORY_CARE_PROVIDER_SITE_OTHER): Payer: No Typology Code available for payment source

## 2020-04-17 VITALS — BP 122/70 | Wt 125.0 lb

## 2020-04-17 DIAGNOSIS — Z3A13 13 weeks gestation of pregnancy: Secondary | ICD-10-CM

## 2020-04-17 DIAGNOSIS — Z1379 Encounter for other screening for genetic and chromosomal anomalies: Secondary | ICD-10-CM | POA: Diagnosis not present

## 2020-04-17 DIAGNOSIS — Z3A12 12 weeks gestation of pregnancy: Secondary | ICD-10-CM

## 2020-04-17 DIAGNOSIS — Z3401 Encounter for supervision of normal first pregnancy, first trimester: Secondary | ICD-10-CM

## 2020-04-17 NOTE — Progress Notes (Signed)
Routine Prenatal Care Visit  Subjective  Patricia Swanson is a 29 y.o. G1P0000 at [redacted]w[redacted]d being seen today for ongoing prenatal care.  She is currently monitored for the following issues for this low-risk pregnancy and has Breast mass, right; Elevated blood pressure reading; and Pregnancy, supervision, normal, first on their problem list.  ----------------------------------------------------------------------------------- Patient reports no complaints.    . Vag. Bleeding: None.   . Leaking Fluid denies.  NT u/s today. NT = 1.8 mm.  Desires NIPT ----------------------------------------------------------------------------------- The following portions of the patient's history were reviewed and updated as appropriate: allergies, current medications, past family history, past medical history, past social history, past surgical history and problem list. Problem list updated.  Objective  Blood pressure 122/70, weight 125 lb (56.7 kg), last menstrual period 01/18/2020. Pregravid weight 120 lb (54.4 kg) Total Weight Gain 5 lb (2.268 kg) Urinalysis: Urine Protein    Urine Glucose    Fetal Status: Fetal Heart Rate (bpm): 157         General:  Alert, oriented and cooperative. Patient is in no acute distress.  Skin: Skin is warm and dry. No rash noted.   Cardiovascular: Normal heart rate noted  Respiratory: Normal respiratory effort, no problems with respiration noted  Abdomen: Soft, gravid, appropriate for gestational age.       Pelvic:  Cervical exam deferred        Extremities: Normal range of motion.     Mental Status: Normal mood and affect. Normal behavior. Normal judgment and thought content.   Imaging Results US Fetal Nuchal Translucency Measurement  Result Date: 04/17/2020 Patient Name: Patricia Swanson DOB: 1991-01-14 MRN: [id number] ULTRASOUND REPORT Location: [facility] Date of Service: 04/17/2020 Indications:First Trimester Screen - NT Findings: Singleton intrauterine  pregnancy is visualized with a CRL consistent with [redacted]w[redacted]d  gestation, giving an (U/S) EDD of 10/23/2020. The (U/S) EDD is consistent with the clinically established Estimated Date of Delivery: 10/24/2020. FHR: 157 BPM CRL measurement: 66.9 mm NT measurement: 1.8 mm. Nasal Bone is Present Impression: 1. [redacted]w[redacted]d viable Singleton Intrauterine pregnancy. 2. (U/S) EDD is consistent with Clinically established Estimated Date of Delivery: 10/24/20. 3. NT Screen is successfully completed. Deanna Artis, RT There is a viable  singleton gestation.  The fetal biometry correlates with established dating Detailed evaluation of the fetal anatomy is precluded by early gestational age.The NT measurement is less than 57mm.    It must be noted that a normal ultrasound is unable to definitively rule out fetal aneuploidy.  [name], MD, [name], [facility] 04/17/2020, 2:53 PM.     Assessment   29 y.o. G1P0000 at [redacted]w[redacted]d by  10/24/2020, by Last Menstrual Period presenting for routine prenatal visit  Plan   FIRST Problems (from 03/08/20 to present)    Problem Noted Resolved   Pregnancy, supervision, normal, first 03/08/2020 by Tresea Mall, CNM No   Overview Signed 03/08/2020  3:17 PM by Tresea Mall, CNM    Clinic [facility] Prenatal Labs  Dating  Blood type:     Genetic Screen 1 Screen:    AFP:     Quad:     NIPS: Antibody:   Anatomic [place]  Rubella:   Varicella: @VZVIGG @  GTT Early:  NA              Third trimester:  RPR:     Rhogam  HBsAg:     Vaccines TDAP:  Flu Shot: HIV:     Baby Food Breast                               GBS:   Contraception  Pap: 2019 negative  CBB     CS/VBAC NA   Support Person [name] husband              Preterm labor symptoms and general obstetric precautions including but not limited to vaginal bleeding, contractions, leaking of fluid and fetal movement were reviewed in detail with the patient. Please refer to After Visit  Summary for other counseling recommendations.   Return in about 4 weeks (around 05/15/2020) for Routine Prenatal Appointment.  [name], MD, [name], [facility] 04/17/2020 3:04 PM

## 2020-04-23 LAB — MATERNIT 21 PLUS CORE, BLOOD
Fetal Fraction: 7
Result (T21): NEGATIVE
Trisomy 13 (Patau syndrome): NEGATIVE
Trisomy 18 (Edwards syndrome): NEGATIVE
Trisomy 21 (Down syndrome): NEGATIVE

## 2020-05-11 IMAGING — US ULTRASOUND RIGHT BREAST LIMITED
1 series · 5 of 5 positions shown · non-contrast
Comparison: Previous exam(s).

CLINICAL DATA: Mass identified within the upper RIGHT breast on
diagnostic ultrasound of 05/04/2015 for which a six-month follow-up
ultrasound was recommended to ensure stability. Patient returns
today for the follow-up diagnostic exam.

EXAM:
ULTRASOUND OF THE RIGHT BREAST

[Series 1: ultrasound right breast limited · 0.06mm/px · 5 of 5 slices shown]
[im 1/5]
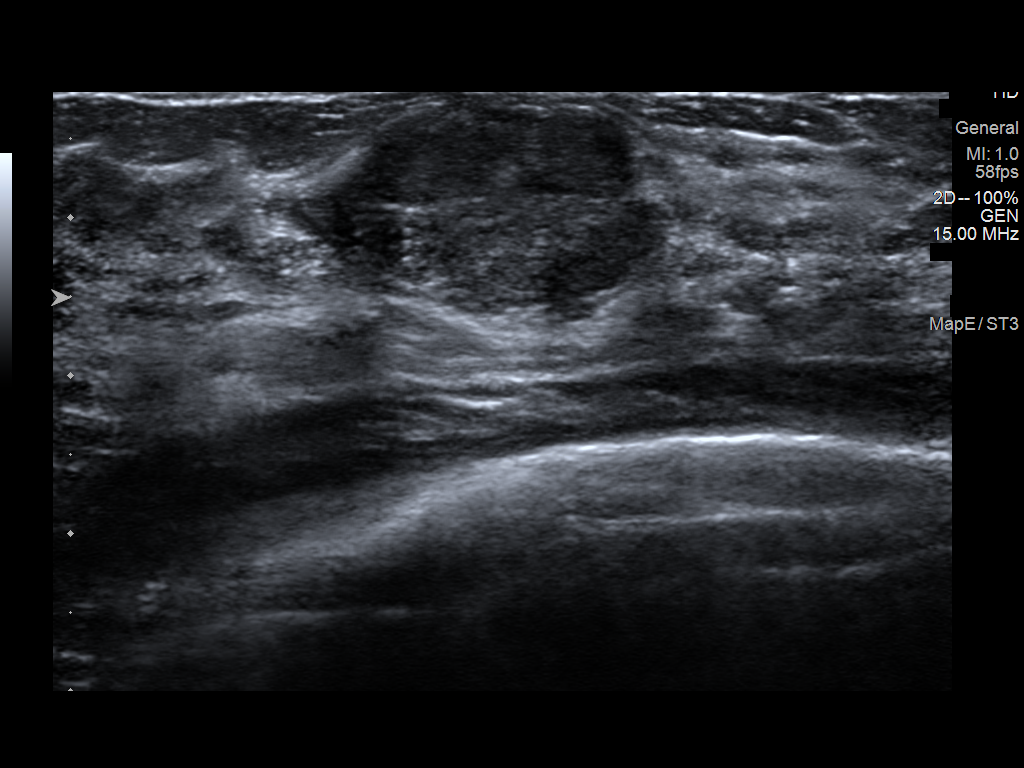
[im 2/5]
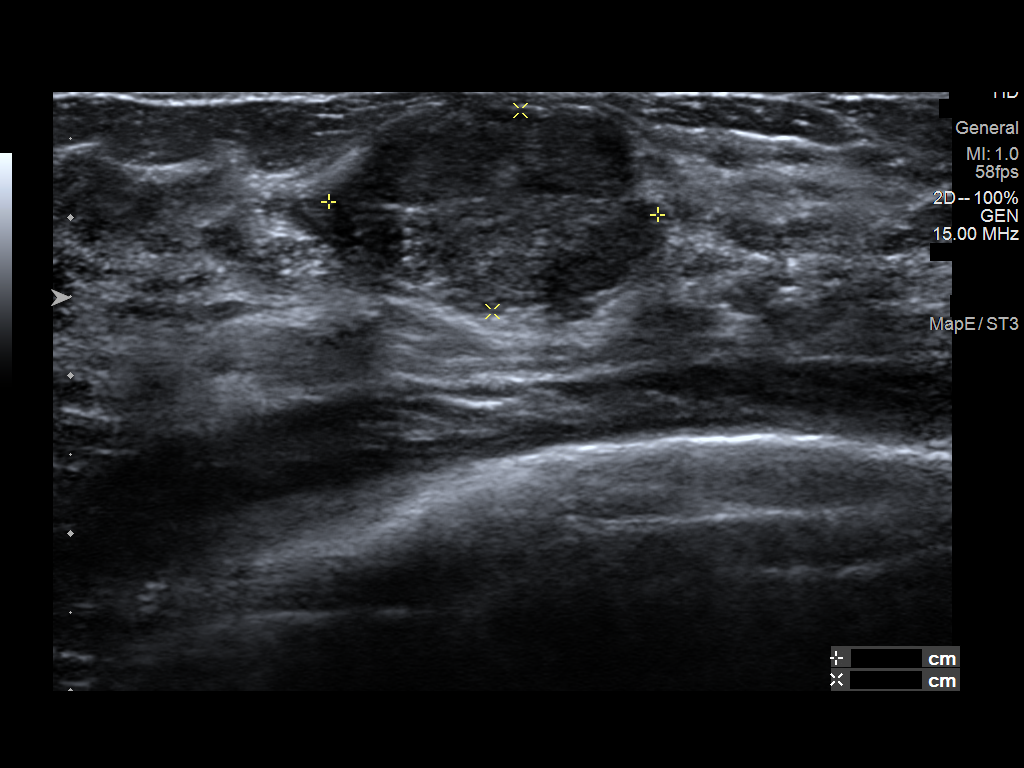
[im 3/5]
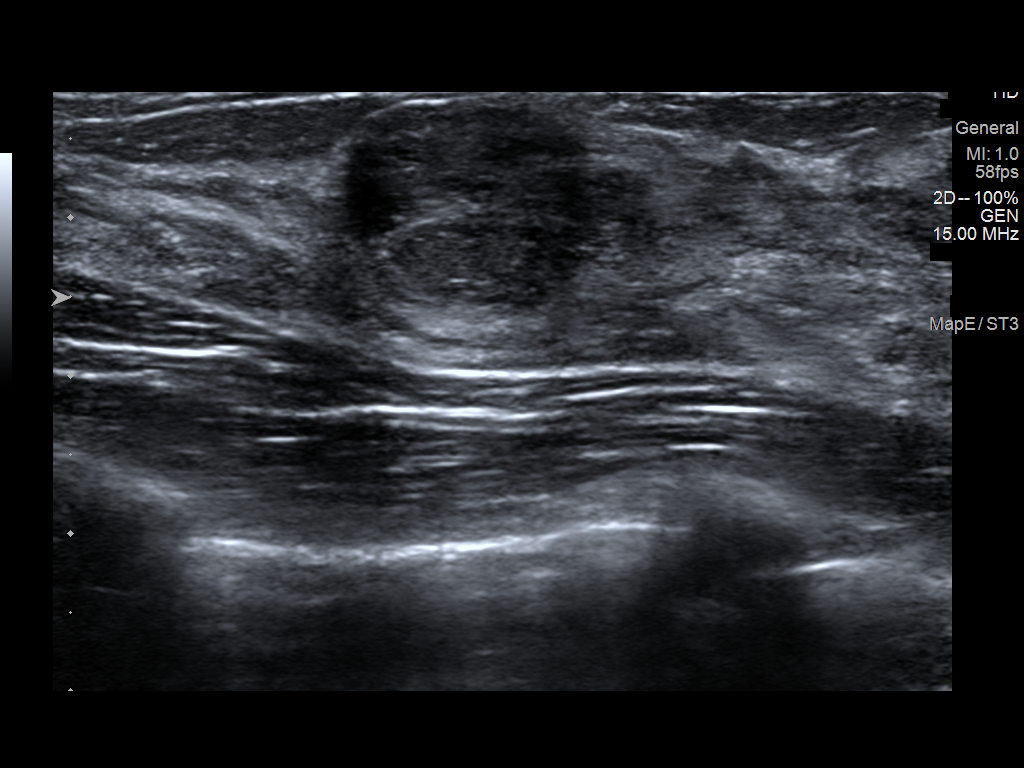
[im 4/5]
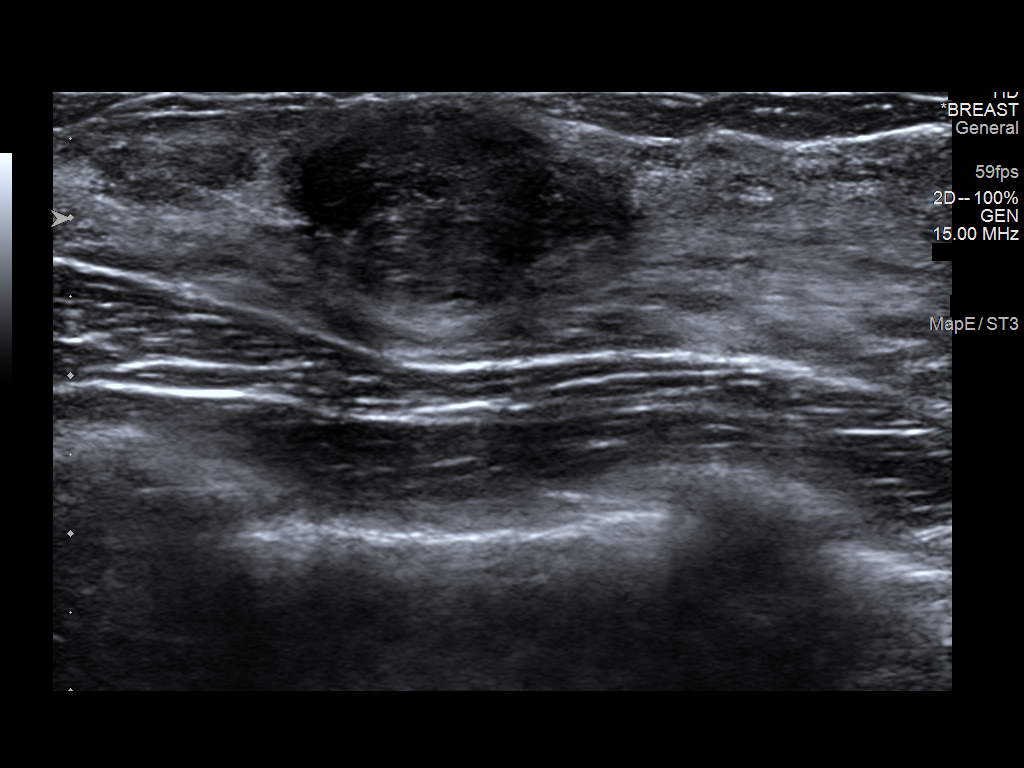
[im 5/5]
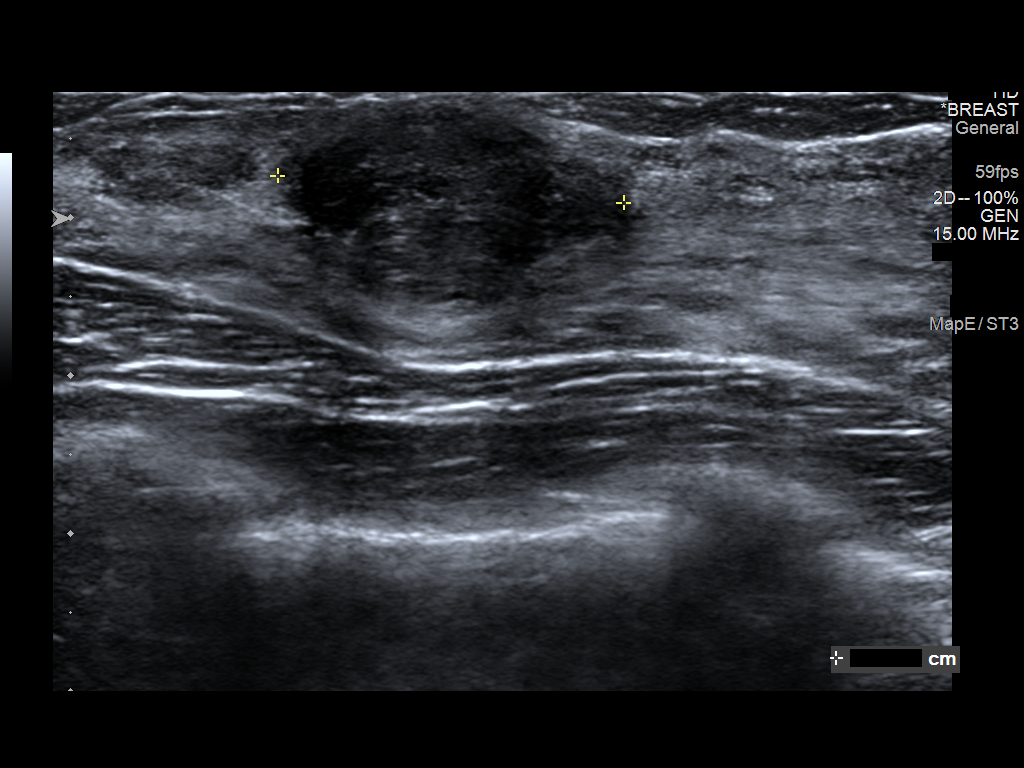

[5 of 5 positions shown; findings below may reference images not displayed]

FINDINGS: Targeted ultrasound is performed, again showing an oval
circumscribed hypoechoic mass in the RIGHT breast at the 12 o'clock
axis, 6 cm from the nipple, measuring 2.2 x 1.3 x 2.1 cm, stable
compared to the previous examination confirming benignity.
IMPRESSION: Stable benign mass within the RIGHT breast at the 12 o'clock axis,
measuring 2.2 cm, presumed fibroadenoma.

RECOMMENDATION:
1. Screening mammogram at age 40 unless there are persistent or
intervening clinical concerns. (Code:[id number]-[id number])
2. The patient was instructed to return sooner if the area that she
feels becomes larger and/or firmer to palpation, or if a new
palpable abnormality is identified in either breast.

I have discussed the findings and recommendations with the patient.
Results were also provided in writing at the conclusion of the
visit. If applicable, a reminder letter will be sent to the patient
regarding the next appointment.

BI-RADS CATEGORY  2: Benign.

## 2020-05-16 ENCOUNTER — Ambulatory Visit (INDEPENDENT_AMBULATORY_CARE_PROVIDER_SITE_OTHER): Payer: No Typology Code available for payment source | Admitting: Obstetrics and Gynecology

## 2020-05-16 ENCOUNTER — Other Ambulatory Visit: Payer: Self-pay

## 2020-05-16 ENCOUNTER — Encounter: Payer: Self-pay | Admitting: Obstetrics and Gynecology

## 2020-05-16 VITALS — BP 120/76 | Wt 128.0 lb

## 2020-05-16 DIAGNOSIS — Z3A17 17 weeks gestation of pregnancy: Secondary | ICD-10-CM

## 2020-05-16 DIAGNOSIS — Z3402 Encounter for supervision of normal first pregnancy, second trimester: Secondary | ICD-10-CM

## 2020-05-16 NOTE — Patient Instructions (Signed)

## 2020-05-16 NOTE — Progress Notes (Signed)
  Routine Prenatal Care Visit  Subjective  Patricia Swanson is a 29 y.o. G1P0000 at [redacted]w[redacted]d being seen today for ongoing prenatal care.  She is currently monitored for the following issues for this low-risk pregnancy and has Breast mass, right; Elevated blood pressure reading; and Pregnancy, supervision, normal, first on their problem list.  ----------------------------------------------------------------------------------- Patient reports occasional HAs and nausea.   Contractions: Not present. Vag. Bleeding: None.  Movement: Absent. Leaking Fluid denies.  ----------------------------------------------------------------------------------- The following portions of the patient's history were reviewed and updated as appropriate: allergies, current medications, past family history, past medical history, past social history, past surgical history and problem list. Problem list updated.  Objective  Blood pressure 120/76, weight 128 lb (58.1 kg), last menstrual period 01/18/2020. Pregravid weight 120 lb (54.4 kg) Total Weight Gain 8 lb (3.629 kg) Urinalysis: Urine Protein    Urine Glucose    Fetal Status: Fetal Heart Rate (bpm): 145   Movement: Absent     General:  Alert, oriented and cooperative. Patient is in no acute distress.  Skin: Skin is warm and dry. No rash noted.   Cardiovascular: Normal heart rate noted  Respiratory: Normal respiratory effort, no problems with respiration noted  Abdomen: Soft, gravid, appropriate for gestational age. Pain/Pressure: Absent     Pelvic:  Cervical exam deferred        Extremities: Normal range of motion.     Mental Status: Normal mood and affect. Normal behavior. Normal judgment and thought content.   Assessment   29 y.o. G1P0000 at [redacted]w[redacted]d by  10/24/2020, by Last Menstrual Period presenting for routine prenatal visit  Plan   FIRST Problems (from 03/08/20 to present)    Problem Noted Resolved   Pregnancy, supervision, normal, first 03/08/2020 by  Tresea Mall, CNM No   Overview Addendum 05/16/2020  4:08 PM by Conard Novak, MD    [facility]inic [facility] Prenatal Labs  Dating L=10 Blood type: AB/Positive/-- (05/27 1522)   Genetic Screen NIPS: diploid XX Antibody:Negative (05/27 1522)  Anatomic [place]  Rubella: 1.39 (05/27 1522)  Varicella: immune  GTT Early:  NA              Third trimester:  RPR: Non Reactive (05/27 1522)   Rhogam  HBsAg: Negative (05/27 1522)   Vaccines TDAP:                       Flu Shot: HIV: Non Reactive (05/27 1522)   Baby Food Breast                               GBS:   Contraception  Pap: 2019 negative  CBB     CS/VBAC NA   Support Person [name] husband          Previous Version       Preterm labor symptoms and general obstetric precautions including but not limited to vaginal bleeding, contractions, leaking of fluid and fetal movement were reviewed in detail with the patient. Please refer to After Visit Summary for other counseling recommendations.   Return in about 2 weeks (around 05/30/2020) for Anatomy u/s and routine prenatal.  [name], MD, [name], [facility] 05/16/2020 4:27 PM

## 2020-05-28 ENCOUNTER — Other Ambulatory Visit: Payer: Self-pay

## 2020-05-28 ENCOUNTER — Ambulatory Visit (INDEPENDENT_AMBULATORY_CARE_PROVIDER_SITE_OTHER): Payer: No Typology Code available for payment source | Admitting: Obstetrics

## 2020-05-28 ENCOUNTER — Ambulatory Visit (INDEPENDENT_AMBULATORY_CARE_PROVIDER_SITE_OTHER): Payer: No Typology Code available for payment source

## 2020-05-28 VITALS — Wt 128.0 lb

## 2020-05-28 DIAGNOSIS — Z3A18 18 weeks gestation of pregnancy: Secondary | ICD-10-CM

## 2020-05-28 DIAGNOSIS — Z3A19 19 weeks gestation of pregnancy: Secondary | ICD-10-CM

## 2020-05-28 DIAGNOSIS — Z3402 Encounter for supervision of normal first pregnancy, second trimester: Secondary | ICD-10-CM

## 2020-05-28 NOTE — Progress Notes (Signed)
Anatomy u/s today. No vb. No lof. Pt has rash under arm.

## 2020-05-28 NOTE — Progress Notes (Signed)
  Routine Prenatal Care Visit  Subjective  Patricia Swanson is a 29 y.o. G1P0000 at [redacted]w[redacted]d being seen today for ongoing prenatal care.  She is currently monitored for the following issues for this low-risk pregnancy and has Breast mass, right; Elevated blood pressure reading; and Pregnancy, supervision, normal, first on their problem list.  ----------------------------------------------------------------------------------- Patient reports no complaints.   Contractions: Not present. Vag. Bleeding: None.   . Leaking Fluid denies.  ----------------------------------------------------------------------------------- The following portions of the patient's history were reviewed and updated as appropriate: allergies, current medications, past family history, past medical history, past social history, past surgical history and problem list. Problem list updated.  Objective  Weight 128 lb (58.1 kg), last menstrual period 01/18/2020. Pregravid weight 120 lb (54.4 kg) Total Weight Gain 8 lb (3.629 kg) Urinalysis: Urine Protein    Urine Glucose    Fetal Status:           General:  Alert, oriented and cooperative. Patient is in no acute distress.  Skin: Skin is warm and dry. No rash noted.   Cardiovascular: Normal heart rate noted  Respiratory: Normal respiratory effort, no problems with respiration noted  Abdomen: Soft, gravid, appropriate for gestational age. Pain/Pressure: Absent     Pelvic:  Cervical exam deferred        Extremities: Normal range of motion.     Mental Status: Normal mood and affect. Normal behavior. Normal judgment and thought content.   Assessment   29 y.o. G1P0000 at [redacted]w[redacted]d by  10/24/2020, by Last Menstrual Period presenting for routine prenatal visit  Plan   FIRST Problems (from 03/08/20 to present)    Problem Noted Resolved   Pregnancy, supervision, normal, first 03/08/2020 by Tresea Mall, CNM No   Overview Addendum 05/16/2020  4:08 PM by Conard Novak, MD     [facility]inic [facility] Prenatal Labs  Dating L=10 Blood type: AB/Positive/-- (05/27 1522)   Genetic Screen NIPS: diploid XX Antibody:Negative (05/27 1522)  Anatomic [place]  Rubella: 1.39 (05/27 1522)  Varicella: immune  GTT Early:  NA              Third trimester:  RPR: Non Reactive (05/27 1522)   Rhogam  HBsAg: Negative (05/27 1522)   Vaccines TDAP:                       Flu Shot: HIV: Non Reactive (05/27 1522)   Baby Food Breast                               GBS:   Contraception  Pap: 2019 negative  CBB     CS/VBAC NA   Support Person [name] husband          Previous Version       Preterm labor symptoms and general obstetric precautions including but not limited to vaginal bleeding, contractions, leaking of fluid and fetal movement were reviewed in detail with the patient. Please refer to After Visit Summary for other counseling recommendations.  Her anatomy sono is reviewed with her today. Return in about 4 weeks (around 06/25/2020) for return OB.  Mirna Mires, CNM  05/28/2020 4:41 PM

## 2020-06-25 ENCOUNTER — Encounter: Payer: Self-pay | Admitting: Obstetrics and Gynecology

## 2020-06-25 ENCOUNTER — Ambulatory Visit (INDEPENDENT_AMBULATORY_CARE_PROVIDER_SITE_OTHER): Payer: No Typology Code available for payment source | Admitting: Obstetrics and Gynecology

## 2020-06-25 ENCOUNTER — Other Ambulatory Visit: Payer: Self-pay

## 2020-06-25 VITALS — BP 122/74 | Wt 136.0 lb

## 2020-06-25 DIAGNOSIS — Z3402 Encounter for supervision of normal first pregnancy, second trimester: Secondary | ICD-10-CM

## 2020-06-25 DIAGNOSIS — Z131 Encounter for screening for diabetes mellitus: Secondary | ICD-10-CM

## 2020-06-25 DIAGNOSIS — Z3A22 22 weeks gestation of pregnancy: Secondary | ICD-10-CM

## 2020-06-25 DIAGNOSIS — Z113 Encounter for screening for infections with a predominantly sexual mode of transmission: Secondary | ICD-10-CM

## 2020-06-25 NOTE — Progress Notes (Signed)
°  Routine Prenatal Care Visit  Subjective  Patricia Swanson is a 29 y.o. G1P0000 at [redacted]w[redacted]d being seen today for ongoing prenatal care.  She is currently monitored for the following issues for this low-risk pregnancy and has Breast mass, right and Pregnancy, supervision, normal, first on their problem list.  ----------------------------------------------------------------------------------- Patient reports heartburn for which she has tried Tums and marshmellows which have only been slightly helpful.  She has a history of reflux prior to pregnancy.    Contractions: Not present. Vag. Bleeding: None.  Movement: Present. Leaking Fluid denies.  ----------------------------------------------------------------------------------- The following portions of the patient's history were reviewed and updated as appropriate: allergies, current medications, past family history, past medical history, past social history, past surgical history and problem list. Problem list updated.  Objective  Blood pressure 122/74, weight 136 lb (61.7 kg), last menstrual period 01/18/2020. Pregravid weight 120 lb (54.4 kg) Total Weight Gain 16 lb (7.258 kg) Urinalysis: Urine Protein    Urine Glucose    Fetal Status: Fetal Heart Rate (bpm): 150   Movement: Present     General:  Alert, oriented and cooperative. Patient is in no acute distress.  Skin: Skin is warm and dry. No rash noted.   Cardiovascular: Normal heart rate noted  Respiratory: Normal respiratory effort, no problems with respiration noted  Abdomen: Soft, gravid, appropriate for gestational age. Pain/Pressure: Absent     Pelvic:  Cervical exam deferred        Extremities: Normal range of motion.  Edema: None  Mental Status: Normal mood and affect. Normal behavior. Normal judgment and thought content.   Assessment   29 y.o. G1P0000 at [redacted]w[redacted]d by  10/24/2020, by Last Menstrual Period presenting for routine prenatal visit  Plan   FIRST Problems (from  03/08/20 to present)    Problem Noted Resolved   Pregnancy, supervision, normal, first 03/08/2020 by Tresea Mall, CNM No   Overview Addendum 05/16/2020  4:08 PM by Conard Novak, MD    [facility]inic [facility] Prenatal Labs  Dating L=10 Blood type: AB/Positive/-- (05/27 1522)   Genetic Screen NIPS: diploid XX Antibody:Negative (05/27 1522)  Anatomic [place]  Rubella: 1.39 (05/27 1522)  Varicella: immune  GTT Early:  NA              Third trimester:  RPR: Non Reactive (05/27 1522)   Rhogam  HBsAg: Negative (05/27 1522)   Vaccines TDAP:                       Flu Shot: HIV: Non Reactive (05/27 1522)   Baby Food Breast                               GBS:   Contraception  Pap: 2019 negative  CBB     CS/VBAC NA   Support Person [name] husband         Previous Version       Preterm labor symptoms and general obstetric precautions including but not limited to vaginal bleeding, contractions, leaking of fluid and fetal movement were reviewed in detail with the patient. Please refer to After Visit Summary for other counseling recommendations.   Return in about 4 weeks (around 07/23/2020) for 28 week labs and routine prenatal appt.  [name], MD, [name], [facility] 06/25/2020 4:09 PM

## 2020-07-25 ENCOUNTER — Ambulatory Visit (INDEPENDENT_AMBULATORY_CARE_PROVIDER_SITE_OTHER): Payer: No Typology Code available for payment source | Admitting: Obstetrics and Gynecology

## 2020-07-25 ENCOUNTER — Other Ambulatory Visit: Payer: Self-pay

## 2020-07-25 ENCOUNTER — Other Ambulatory Visit: Payer: No Typology Code available for payment source

## 2020-07-25 ENCOUNTER — Encounter: Payer: Self-pay | Admitting: Obstetrics and Gynecology

## 2020-07-25 VITALS — BP 120/70 | Wt 140.0 lb

## 2020-07-25 DIAGNOSIS — Z3A27 27 weeks gestation of pregnancy: Secondary | ICD-10-CM

## 2020-07-25 DIAGNOSIS — Z131 Encounter for screening for diabetes mellitus: Secondary | ICD-10-CM

## 2020-07-25 DIAGNOSIS — Z3403 Encounter for supervision of normal first pregnancy, third trimester: Secondary | ICD-10-CM

## 2020-07-25 DIAGNOSIS — Z3402 Encounter for supervision of normal first pregnancy, second trimester: Secondary | ICD-10-CM

## 2020-07-25 DIAGNOSIS — Z113 Encounter for screening for infections with a predominantly sexual mode of transmission: Secondary | ICD-10-CM

## 2020-07-25 NOTE — Progress Notes (Signed)
  Routine Prenatal Care Visit  Subjective  Patricia Swanson is a 29 y.o. G1P0000 at [redacted]w[redacted]d being seen today for ongoing prenatal care.  She is currently monitored for the following issues for this low-risk pregnancy and has Breast mass, right and Pregnancy, supervision, normal, first on their problem list.  ----------------------------------------------------------------------------------- Patient reports no complaints.   Contractions: Not present. Vag. Bleeding: None.  Movement: Present. Leaking Fluid denies.  ----------------------------------------------------------------------------------- The following portions of the patient's history were reviewed and updated as appropriate: allergies, current medications, past family history, past medical history, past social history, past surgical history and problem list. Problem list updated.  Objective  Blood pressure 120/70, weight 140 lb (63.5 kg), last menstrual period 01/18/2020. Pregravid weight 120 lb (54.4 kg) Total Weight Gain 20 lb (9.072 kg) Urinalysis: Urine Protein    Urine Glucose    Fetal Status: Fetal Heart Rate (bpm): 145 Fundal Height: 27 cm Movement: Present     General:  Alert, oriented and cooperative. Patient is in no acute distress.  Skin: Skin is warm and dry. No rash noted.   Cardiovascular: Normal heart rate noted  Respiratory: Normal respiratory effort, no problems with respiration noted  Abdomen: Soft, gravid, appropriate for gestational age. Pain/Pressure: Absent     Pelvic:  Cervical exam deferred        Extremities: Normal range of motion.  Edema: None  Mental Status: Normal mood and affect. Normal behavior. Normal judgment and thought content.   Assessment   29 y.o. G1P0000 at [redacted]w[redacted]d by  10/24/2020, by Last Menstrual Period presenting for routine prenatal visit  Plan   FIRST Problems (from 03/08/20 to present)    Problem Noted Resolved   Pregnancy, supervision, normal, first 03/08/2020 by Tresea Mall,  CNM No   Overview Addendum 05/16/2020  4:08 PM by Conard Novak, MD    [facility] Prenatal Labs  Dating L=10 Blood type: AB/Positive/-- (05/27 1522)   Genetic Screen NIPS: diploid XX Antibody:Negative (05/27 1522)  Anatomic [place]  Rubella: 1.39 (05/27 1522)  Varicella: immune  GTT Early:  NA              Third trimester:  RPR: Non Reactive (05/27 1522)   Rhogam  HBsAg: Negative (05/27 1522)   Vaccines TDAP:                       Flu Shot: HIV: Non Reactive (05/27 1522)   Baby Food Breast                               GBS:   Contraception  Pap: 2019 negative  CBB     CS/VBAC NA   Support Person [name] husband          Previous Version       Preterm labor symptoms and general obstetric precautions including but not limited to vaginal bleeding, contractions, leaking of fluid and fetal movement were reviewed in detail with the patient. Please refer to After Visit Summary for other counseling recommendations.   - 28 week labs today  Return in about 3 weeks (around 08/15/2020) for Routine Prenatal Appointment.  [name], MD, [name], [facility] 07/25/2020 9:38 AM

## 2020-07-26 LAB — 28 WEEKS RH-PANEL
Antibody Screen: NEGATIVE
Basophils Absolute: 0 10*3/uL (ref 0.0–0.2)
Basos: 0 %
EOS (ABSOLUTE): 0.1 10*3/uL (ref 0.0–0.4)
Eos: 1 %
Gestational Diabetes Screen: 91 mg/dL (ref 65–139)
HIV Screen 4th Generation wRfx: NONREACTIVE
Hematocrit: 32.6 % — ABNORMAL LOW (ref 34.0–46.6)
Hemoglobin: 10.7 g/dL — ABNORMAL LOW (ref 11.1–15.9)
Immature Grans (Abs): 0.1 10*3/uL (ref 0.0–0.1)
Immature Granulocytes: 1 %
Lymphocytes Absolute: 1.3 10*3/uL (ref 0.7–3.1)
Lymphs: 12 %
MCH: 31.2 pg (ref 26.6–33.0)
MCHC: 32.8 g/dL (ref 31.5–35.7)
MCV: 95 fL (ref 79–97)
Monocytes Absolute: 0.7 10*3/uL (ref 0.1–0.9)
Monocytes: 6 %
Neutrophils Absolute: 8.5 10*3/uL — ABNORMAL HIGH (ref 1.4–7.0)
Neutrophils: 80 %
Platelets: 212 10*3/uL (ref 150–450)
RBC: 3.43 x10E6/uL — ABNORMAL LOW (ref 3.77–5.28)
RDW: 12.1 % (ref 11.7–15.4)
RPR Ser Ql: NONREACTIVE
WBC: 10.6 10*3/uL (ref 3.4–10.8)

## 2020-08-10 NOTE — Telephone Encounter (Signed)
Per AMS non-emergent at her age not related something like cancer in pregnancy so first available is fine. Patient is scheduled for 08/14/20 with SDJ.

## 2020-08-14 ENCOUNTER — Encounter: Payer: Self-pay | Admitting: Obstetrics and Gynecology

## 2020-08-14 ENCOUNTER — Other Ambulatory Visit: Payer: Self-pay

## 2020-08-14 ENCOUNTER — Ambulatory Visit (INDEPENDENT_AMBULATORY_CARE_PROVIDER_SITE_OTHER): Payer: No Typology Code available for payment source | Admitting: Obstetrics and Gynecology

## 2020-08-14 VITALS — BP 122/70 | Wt 143.0 lb

## 2020-08-14 DIAGNOSIS — Z3403 Encounter for supervision of normal first pregnancy, third trimester: Secondary | ICD-10-CM

## 2020-08-14 DIAGNOSIS — N6459 Other signs and symptoms in breast: Secondary | ICD-10-CM

## 2020-08-14 DIAGNOSIS — Z3A29 29 weeks gestation of pregnancy: Secondary | ICD-10-CM

## 2020-08-14 NOTE — Progress Notes (Signed)
Routine Prenatal Care Visit  Subjective  Patricia Swanson is a 29 y.o. G1P0000 at [redacted]w[redacted]d being seen today for ongoing prenatal care.  She is currently monitored for the following issues for this low-risk pregnancy and has Breast mass, right and Pregnancy, supervision, normal, first on their problem list.  ----------------------------------------------------------------------------------- Patient reports two episodes of blood from her right nipple. The first was about a dime size on her shirt. The second was bigger and had bled through her shirt. Nothing active today. She can feel no lumps. 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 .   Contractions: Not present. Vag. Bleeding: None.  Movement: Present. Leaking Fluid denies.  ----------------------------------------------------------------------------------- The following portions of the patient's history were reviewed and updated as appropriate: allergies, current medications, past family history, past medical history, past social history, past surgical history and problem list. Problem list updated.  Objective  Blood pressure 122/70, weight 143 lb (64.9 kg), last menstrual period 01/18/2020. Pregravid weight 120 lb (54.4 kg) Total Weight Gain 23 lb (10.4 kg) Urinalysis: Urine Protein    Urine Glucose    Fetal Status: Fetal Heart Rate (bpm): 140 Fundal Height: 29 cm Movement: Present     General:  Alert, oriented and cooperative. Patient is in no acute distress.  Skin: Skin is warm and dry. No rash noted.   Cardiovascular: Normal heart rate noted  Respiratory: Normal respiratory effort, no problems with respiration noted  Abdomen: Soft, gravid, appropriate for gestational age. Pain/Pressure: Absent     Pelvic:  Cervical exam deferred        Extremities: Normal range of motion.     Mental Status: Normal mood and affect. Normal behavior. Normal judgment and  thought content.   Breast exam: Breast symmetric bilaterally.  Symmetric bilateral mild nipple inversion.  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 No skin changes. No blood expressed on exam.  Non-tender.  Assessment   29 y.o. G1P0000 at [redacted]w[redacted]d by  10/24/2020, by Last Menstrual Period presenting for routine prenatal visit  Plan   FIRST Problems (from 03/08/20 to present)    Problem Noted Resolved   Pregnancy, supervision, normal, first 03/08/2020 by Tresea Mall, CNM No   Overview Addendum 05/16/2020  4:08 PM by Conard Novak, MD    [facility] Prenatal Labs  Dating L=10 Blood type: AB/Positive/-- (05/27 1522)   Genetic Screen NIPS: diploid XX Antibody:Negative (05/27 1522)  Anatomic [place]  Rubella: 1.39 (05/27 1522)  Varicella: immune  GTT Early:  NA              Third trimester:  RPR: Non Reactive (05/27 1522)   Rhogam  HBsAg: Negative (05/27 1522)   Vaccines TDAP:                       Flu Shot: HIV: Non Reactive (05/27 1522)   Baby Food Breast                               GBS:   Contraception  Pap: 2019 negative  CBB     CS/VBAC NA   Support Person [name] husband          Previous Version       Preterm labor symptoms and general obstetric precautions  including but not limited to vaginal bleeding, contractions, leaking of fluid and fetal movement were reviewed in detail with the patient. Please refer to After Visit Summary for other counseling recommendations.   Discussed the blood from nipple can be normal in up to 20% of pregnancies and can be a variant of normal physiologic changes.  Offered either following and assessing 2 weeks at next appointment or getting an ultrasound now.  She is OK waiting or if something happens between now and her next appt.   Return in about 2 weeks (around 08/28/2020) for Routine Prenatal Appointment.   [name], MD, [name]  [facility]/GYN, [facility] 08/14/2020 3:49 PM

## 2020-08-28 ENCOUNTER — Other Ambulatory Visit: Payer: Self-pay

## 2020-08-28 ENCOUNTER — Encounter: Payer: Self-pay | Admitting: Obstetrics and Gynecology

## 2020-08-28 ENCOUNTER — Ambulatory Visit (INDEPENDENT_AMBULATORY_CARE_PROVIDER_SITE_OTHER): Payer: No Typology Code available for payment source | Admitting: Obstetrics and Gynecology

## 2020-08-28 VITALS — BP 120/74 | Wt 146.0 lb

## 2020-08-28 DIAGNOSIS — Z3403 Encounter for supervision of normal first pregnancy, third trimester: Secondary | ICD-10-CM

## 2020-08-28 DIAGNOSIS — O2243 Hemorrhoids in pregnancy, third trimester: Secondary | ICD-10-CM

## 2020-08-28 DIAGNOSIS — Z23 Encounter for immunization: Secondary | ICD-10-CM

## 2020-08-28 DIAGNOSIS — Z3A31 31 weeks gestation of pregnancy: Secondary | ICD-10-CM

## 2020-08-28 DIAGNOSIS — N6459 Other signs and symptoms in breast: Secondary | ICD-10-CM

## 2020-08-28 NOTE — Progress Notes (Addendum)
  Routine Prenatal Care Visit  Subjective  Patricia Swanson is a 29 y.o. G1P0000 at [redacted]w[redacted]d being seen today for ongoing prenatal care.  She is currently monitored for the following issues for this low-risk pregnancy and has Breast mass, right and Pregnancy, supervision, normal, first on their problem list.  ----------------------------------------------------------------------------------- Patient reports more bleeding from right nipple last night. This has been ongoing and can be normal in pregnancy. However, will investigate. Also, has symptomatic hemorrhoids.   Contractions: Not present. Vag. Bleeding: None.  Movement: Present. Leaking Fluid denies.  ----------------------------------------------------------------------------------- The following portions of the patient's history were reviewed and updated as appropriate: allergies, current medications, past family history, past medical history, past social history, past surgical history and problem list. Problem list updated.  Objective  Blood pressure 120/74, weight 146 lb (66.2 kg), last menstrual period 01/18/2020. Pregravid weight 120 lb (54.4 kg) Total Weight Gain 26 lb (11.8 kg) Urinalysis: Urine Protein    Urine Glucose    Fetal Status: Fetal Heart Rate (bpm): 135 Fundal Height: 31 cm Movement: Present     General:  Alert, oriented and cooperative. Patient is in no acute distress.  Skin: Skin is warm and dry. No rash noted.   Cardiovascular: Normal heart rate noted  Respiratory: Normal respiratory effort, no problems with respiration noted  Abdomen: Soft, gravid, appropriate for gestational age. Pain/Pressure: Absent     Pelvic:  Cervical exam deferred        Extremities: Normal range of motion.  Edema: None  Mental Status: Normal mood and affect. Normal behavior. Normal judgment and thought content.   Assessment   29 y.o. G1P0000 at [redacted]w[redacted]d by  10/24/2020, by Last Menstrual Period presenting for routine prenatal  visit  Plan   FIRST Problems (from 03/08/20 to present)    Problem Noted Resolved   Pregnancy, supervision, normal, first 03/08/2020 by Tresea Mall, CNM No   Overview Addendum 05/16/2020  4:08 PM by Conard Novak, MD    [facility] Prenatal Labs  Dating L=10 Blood type: AB/Positive/-- (05/27 1522)   Genetic Screen NIPS: diploid XX Antibody:Negative (05/27 1522)  Anatomic [place]  Rubella: 1.39 (05/27 1522)  Varicella: immune  GTT Early:  NA              Third trimester:  RPR: Non Reactive (05/27 1522)   Rhogam  HBsAg: Negative (05/27 1522)   Vaccines TDAP:                       Flu Shot: HIV: Non Reactive (05/27 1522)   Baby Food Breast                               GBS:   Contraception  Pap: 2019 negative  CBB     CS/VBAC NA   Support Person [name] husband          Previous Version       Preterm labor symptoms and general obstetric precautions including but not limited to vaginal bleeding, contractions, leaking of fluid and fetal movement were reviewed in detail with the patient. Please refer to After Visit Summary for other counseling recommendations.   - TDaP today - U/S right breast due to persistent bleeding from nipple.  Return in about 2 weeks (around 09/11/2020) for Routine Prenatal Appointment.   [name], MD, [name], [facility] 08/30/2020 4:19 PM

## 2020-08-28 NOTE — Addendum Note (Signed)
Addended by: Liliane Shi on: 08/28/2020 05:03 PM   Modules accepted: Orders

## 2020-08-30 MED ORDER — HYDROCORTISONE ACETATE 25 MG RE SUPP
25.0000 mg | Freq: Two times a day (BID) | RECTAL | 2 refills | Status: AC
Start: 2020-08-30 — End: 2020-09-04

## 2020-08-30 NOTE — Addendum Note (Signed)
Addended by: [name] D on: 08/30/2020 04:23 PM   Modules accepted: Orders

## 2020-09-11 ENCOUNTER — Ambulatory Visit
Admission: RE | Admit: 2020-09-11 | Discharge: 2020-09-11 | Disposition: A | Discharge status: Home / Self Care | Payer: No Typology Code available for payment source | Source: Ambulatory Visit | Attending: Obstetrics and Gynecology | Admitting: Obstetrics and Gynecology

## 2020-09-11 ENCOUNTER — Encounter: Payer: Self-pay | Admitting: Obstetrics and Gynecology

## 2020-09-11 ENCOUNTER — Other Ambulatory Visit: Payer: Self-pay

## 2020-09-11 ENCOUNTER — Other Ambulatory Visit: Payer: Self-pay | Admitting: Obstetrics and Gynecology

## 2020-09-11 ENCOUNTER — Ambulatory Visit (INDEPENDENT_AMBULATORY_CARE_PROVIDER_SITE_OTHER): Payer: No Typology Code available for payment source | Admitting: Obstetrics and Gynecology

## 2020-09-11 VITALS — BP 122/74 | Wt 147.0 lb

## 2020-09-11 DIAGNOSIS — N6459 Other signs and symptoms in breast: Secondary | ICD-10-CM | POA: Insufficient documentation

## 2020-09-11 DIAGNOSIS — Z3403 Encounter for supervision of normal first pregnancy, third trimester: Secondary | ICD-10-CM

## 2020-09-11 DIAGNOSIS — Z3A33 33 weeks gestation of pregnancy: Secondary | ICD-10-CM

## 2020-09-11 DIAGNOSIS — N6310 Unspecified lump in the right breast, unspecified quadrant: Secondary | ICD-10-CM

## 2020-09-11 NOTE — Progress Notes (Signed)
Routine Prenatal Care Visit  Subjective  Linzy Daphine Loch is a 29 y.o. G1P0000 at 103w6d being seen today for ongoing prenatal care.  She is currently monitored for the following issues for this low-risk pregnancy and has Breast mass, right and Pregnancy, supervision, normal, first on their problem list.  ----------------------------------------------------------------------------------- Patient reports no complaints.   Contractions: Not present. Vag. Bleeding: None.  Movement: Present. Leaking Fluid denies.  ----------------------------------------------------------------------------------- The following portions of the patient's history were reviewed and updated as appropriate: allergies, current medications, past family history, past medical history, past social history, past surgical history and problem list. Problem list updated.  Objective  Blood pressure 122/74, weight 147 lb (66.7 kg), last menstrual period 01/18/2020. Pregravid weight 120 lb (54.4 kg) Total Weight Gain 27 lb (12.2 kg) Urinalysis: Urine Protein    Urine Glucose    Fetal Status: Fetal Heart Rate (bpm): 135 Fundal Height: 33 cm Movement: Present     General:  Alert, oriented and cooperative. Patient is in no acute distress.  Skin: Skin is warm and dry. No rash noted.   Cardiovascular: Normal heart rate noted  Respiratory: Normal respiratory effort, no problems with respiration noted  Abdomen: Soft, gravid, appropriate for gestational age. Pain/Pressure: Absent     Pelvic:  Cervical exam deferred        Extremities: Normal range of motion.  Edema: None  Mental Status: Normal mood and affect. Normal behavior. Normal judgment and thought content.   Assessment   29 y.o. G1P0000 at [redacted]w[redacted]d by  10/24/2020, by Last Menstrual Period presenting for routine prenatal visit  Plan   FIRST Problems (from 03/08/20 to present)    Problem Noted Resolved   Pregnancy, supervision, normal, first 03/08/2020 by Tresea Mall,  CNM No   Overview Addendum 05/16/2020  4:08 PM by Conard Novak, MD    [facility] Prenatal Labs  Dating L=10 Blood type: AB/Positive/-- (05/27 1522)   Genetic Screen NIPS: diploid XX Antibody:Negative (05/27 1522)  Anatomic [place]  Rubella: 1.39 (05/27 1522)  Varicella: immune  GTT Early:  NA              Third trimester:  RPR: Non Reactive (05/27 1522)   Rhogam  HBsAg: Negative (05/27 1522)   Vaccines TDAP:                       Flu Shot: HIV: Non Reactive (05/27 1522)   Baby Food Breast                               GBS:   Contraception  Pap: 2019 negative  CBB     CS/VBAC NA   Support Person [name] husband          Previous Version   Breast mass, right 09/02/2018 by Carlis Stable, PA-C No   Overview Signed 09/11/2020  4:35 PM by Conard Novak, MD    [x]  negative mammo/u/s 09/11/20 at [redacted]w[redacted]d           Preterm labor symptoms and general obstetric precautions including but not limited to vaginal bleeding, contractions, leaking of fluid and fetal movement were reviewed in detail with the patient. Please refer to After Visit Summary for other counseling recommendations.   - negative u/s and mammography of breast today. Patient reassured  Return in about 2 weeks (around 09/25/2020) for Routine Prenatal Appointment.   09/27/2020, MD, [facility], [facility] 09/11/2020 4:35 PM

## 2020-09-14 ENCOUNTER — Ambulatory Visit (INDEPENDENT_AMBULATORY_CARE_PROVIDER_SITE_OTHER): Payer: Self-pay | Admitting: Pediatrics

## 2020-09-14 ENCOUNTER — Other Ambulatory Visit: Payer: Self-pay

## 2020-09-14 DIAGNOSIS — Z7681 Expectant parent(s) prebirth pediatrician visit: Secondary | ICD-10-CM

## 2020-09-14 NOTE — Progress Notes (Signed)
Prenatal counseling for impending newborn done--  Reviewed current vaccine policy and answered all questions from parents.  1st child, Currently 34 weeks, Current complications:  none, Prenatal care initiated:  early Z76.81

## 2020-09-27 ENCOUNTER — Encounter: Payer: Self-pay | Admitting: Obstetrics and Gynecology

## 2020-09-27 ENCOUNTER — Other Ambulatory Visit: Payer: Self-pay

## 2020-09-27 ENCOUNTER — Ambulatory Visit (INDEPENDENT_AMBULATORY_CARE_PROVIDER_SITE_OTHER): Payer: No Typology Code available for payment source | Admitting: Obstetrics and Gynecology

## 2020-09-27 ENCOUNTER — Other Ambulatory Visit (HOSPITAL_COMMUNITY)
Admission: RE | Admit: 2020-09-27 | Discharge: 2020-09-27 | Disposition: A | Discharge status: Home / Self Care | Payer: No Typology Code available for payment source | Source: Ambulatory Visit | Attending: Obstetrics and Gynecology | Admitting: Obstetrics and Gynecology

## 2020-09-27 VITALS — BP 120/74 | Wt 153.0 lb

## 2020-09-27 DIAGNOSIS — Z3A36 36 weeks gestation of pregnancy: Secondary | ICD-10-CM | POA: Diagnosis present

## 2020-09-27 DIAGNOSIS — Z3403 Encounter for supervision of normal first pregnancy, third trimester: Secondary | ICD-10-CM

## 2020-09-27 NOTE — Progress Notes (Signed)
Routine Prenatal Care Visit  Subjective  Patricia Swanson is a 29 y.o. G1P0000 at [redacted]w[redacted]d being seen today for ongoing prenatal care.  She is currently monitored for the following issues for this low-risk pregnancy and has Breast mass, right and Pregnancy, supervision, normal, first on their problem list.  ----------------------------------------------------------------------------------- Patient reports no complaints.   Contractions: Not present. Vag. Bleeding: None.  Movement: Present. Leaking Fluid denies.  ----------------------------------------------------------------------------------- The following portions of the patient's history were reviewed and updated as appropriate: allergies, current medications, past family history, past medical history, past social history, past surgical history and problem list. Problem list updated.  Objective  Blood pressure 120/74, weight 153 lb (69.4 kg), last menstrual period 01/18/2020. Pregravid weight 120 lb (54.4 kg) Total Weight Gain 33 lb (15 kg) Urinalysis: Urine Protein    Urine Glucose    Fetal Status: Fetal Heart Rate (bpm): 155 Fundal Height: 35 cm Movement: Present  Presentation: Vertex  General:  Alert, oriented and cooperative. Patient is in no acute distress.  Skin: Skin is warm and dry. No rash noted.   Cardiovascular: Normal heart rate noted  Respiratory: Normal respiratory effort, no problems with respiration noted  Abdomen: Soft, gravid, appropriate for gestational age. Pain/Pressure: Absent     Pelvic:  Cervical exam deferred        Extremities: Normal range of motion.  Edema: None  Mental Status: Normal mood and affect. Normal behavior. Normal judgment and thought content.   -BSUS = cephalic  Assessment   29 y.o. G1P0000 at [redacted]w[redacted]d by  10/24/2020, by Last Menstrual Period presenting for routine prenatal visit  Plan   FIRST Problems (from 03/08/20 to present)    Problem Noted Resolved   Pregnancy, supervision,  normal, first 03/08/2020 by Tresea Mall, CNM No   Overview Addendum 05/16/2020  4:08 PM by Conard Novak, MD    [facility] Prenatal Labs  Dating L=10 Blood type: AB/Positive/-- (05/27 1522)   Genetic Screen NIPS: diploid XX Antibody:Negative (05/27 1522)  Anatomic [place]  Rubella: 1.39 (05/27 1522)  Varicella: immune  GTT Early:  NA              Third trimester:  RPR: Non Reactive (05/27 1522)   Rhogam  HBsAg: Negative (05/27 1522)   Vaccines TDAP:                       Flu Shot: HIV: Non Reactive (05/27 1522)   Baby Food Breast                               GBS:   Contraception  Pap: 2019 negative  CBB     CS/VBAC NA   Support Person [name] husband          Previous Version   Breast mass, right 09/02/2018 by Carlis Stable, PA-C No   Overview Signed 09/11/2020  4:35 PM by Conard Novak, MD    [x]  negative mammo/u/s 09/11/20 at [redacted]w[redacted]d           Preterm labor symptoms and general obstetric precautions including but not limited to vaginal bleeding, contractions, leaking of fluid and fetal movement were reviewed in detail with the patient. Please refer to After Visit Summary for other counseling recommendations.   -GBS/aptima today  Return in about 1 week (around 10/04/2020) for Routine Prenatal Appointment (may be after Christmas with SDJ).   05-05-1971, MD, [facility]  OB/GYN, [facility] 09/27/2020 4:35 PM

## 2020-09-30 LAB — STREP GP B NAA: Strep Gp B NAA: NEGATIVE

## 2020-10-01 LAB — CERVICOVAGINAL ANCILLARY ONLY
Chlamydia: NEGATIVE
Comment: NEGATIVE
Comment: NORMAL
Neisseria Gonorrhea: NEGATIVE

## 2020-10-03 ENCOUNTER — Other Ambulatory Visit: Payer: Self-pay

## 2020-10-03 ENCOUNTER — Ambulatory Visit (INDEPENDENT_AMBULATORY_CARE_PROVIDER_SITE_OTHER): Payer: No Typology Code available for payment source | Admitting: Obstetrics and Gynecology

## 2020-10-03 VITALS — BP 122/70 | Wt 155.0 lb

## 2020-10-03 DIAGNOSIS — Z3A37 37 weeks gestation of pregnancy: Secondary | ICD-10-CM

## 2020-10-03 DIAGNOSIS — Z3403 Encounter for supervision of normal first pregnancy, third trimester: Secondary | ICD-10-CM

## 2020-10-03 NOTE — Progress Notes (Signed)
Routine Prenatal Care Visit  Subjective  Patricia Swanson is a 29 y.o. G1P0000 at [redacted]w[redacted]d being seen today for ongoing prenatal care.  She is currently monitored for the following issues for this low-risk pregnancy and has Breast mass, right and Pregnancy, supervision, normal, first on their problem list.  ----------------------------------------------------------------------------------- Patient reports no concerns.   Contractions: Not present. Vag. Bleeding: None.  Movement: Present. Denies leaking of fluid.  ----------------------------------------------------------------------------------- The following portions of the patient's history were reviewed and updated as appropriate: allergies, current medications, past family history, past medical history, past social history, past surgical history and problem list. Problem list updated.   Objective  Blood pressure 122/70, weight 155 lb (70.3 kg), last menstrual period 01/18/2020. Pregravid weight 120 lb (54.4 kg) Total Weight Gain 35 lb (15.9 kg) Urinalysis:      Fetal Status: Fetal Heart Rate (bpm): 140 Fundal Height: 37 cm Movement: Present  Presentation: Vertex  General:  Alert, oriented and cooperative. Patient is in no acute distress.  Skin: Skin is warm and dry. No rash noted.   Cardiovascular: Normal heart rate noted  Respiratory: Normal respiratory effort, no problems with respiration noted  Abdomen: Soft, gravid, appropriate for gestational age. Pain/Pressure: Absent     Pelvic:  Cervical exam deferred        Extremities: Normal range of motion.  Edema: None  ental Status: Normal mood and affect. Normal behavior. Normal judgment and thought content.     Assessment   29 y.o. G1P0000 at [redacted]w[redacted]d by  10/24/2020, by Last Menstrual Period presenting for routine prenatal visit  Plan   FIRST Problems (from 03/08/20 to present)    Problem Noted Resolved   Pregnancy, supervision, normal, first 03/08/2020 by Tresea Mall,  CNM No   Overview Addendum 10/02/2020  8:04 PM by Zipporah Plants, CNM    [facility] Prenatal Labs  Dating L=10 Blood type: AB/Positive/-- (05/27 1522)   Genetic Screen NIPS: diploid XX Antibody:Negative (05/27 1522)  Anatomic US  wnl Rubella: 1.39 (05/27 1522)  Varicella: immune  GTT Early:  NA              Third trimester:  RPR: Non Reactive (05/27 1522)   Rhogam  n/a HBsAg: Negative (05/27 1522)   Vaccines TDAP: 08/28/20                      Flu Shot: HIV: Non Reactive (05/27 1522)   Baby Food Breast                               GBS:  negative  Contraception  Pap: 2019 negative  CBB     CS/VBAC NA   Support Person [name] husband          Previous Version   Breast mass, right 09/02/2018 by Carlis Stable, PA-C No   Overview Signed 09/11/2020  4:35 PM by Conard Novak, MD    [x]  negative mammo/u/s 09/11/20 at [redacted]w[redacted]d          -Reviewed S&S of labor, now at term, patient desires spontaneous onset of labor - discussed 39w eIOL vs 41w post-dates IOL -Reviewed pain management in labor - patient desires epidural  Term labor precautions including but not limited to vaginal bleeding, contractions, leaking of fluid and fetal movement were reviewed in detail with the patient.    -Keep previously scheduled f/u  [redacted]w[redacted]d, CNM, MSN [facility],  [facility] 10/03/2020, 3:02 PM

## 2020-10-11 ENCOUNTER — Encounter: Payer: Self-pay | Admitting: Obstetrics and Gynecology

## 2020-10-11 ENCOUNTER — Ambulatory Visit (INDEPENDENT_AMBULATORY_CARE_PROVIDER_SITE_OTHER): Payer: No Typology Code available for payment source | Admitting: Obstetrics and Gynecology

## 2020-10-11 ENCOUNTER — Other Ambulatory Visit: Payer: Self-pay

## 2020-10-11 VITALS — BP 122/69 | Wt 153.0 lb

## 2020-10-11 DIAGNOSIS — Z3403 Encounter for supervision of normal first pregnancy, third trimester: Secondary | ICD-10-CM

## 2020-10-11 DIAGNOSIS — Z3A38 38 weeks gestation of pregnancy: Secondary | ICD-10-CM

## 2020-10-11 NOTE — Progress Notes (Signed)
Routine Prenatal Care Visit  Subjective  Patricia Swanson is a 29 y.o. G1P0000 at [redacted]w[redacted]d being seen today for ongoing prenatal care.  She is currently monitored for the following issues for this low-risk pregnancy and has Breast mass, right and Pregnancy, supervision, normal, first on their problem list.  ----------------------------------------------------------------------------------- Patient reports no complaints.   Contractions: Not present. Vag. Bleeding: None.  Movement: Present. Leaking Fluid denies.  ----------------------------------------------------------------------------------- The following portions of the patient's history were reviewed and updated as appropriate: allergies, current medications, past family history, past medical history, past social history, past surgical history and problem list. Problem list updated.  Objective  Blood pressure 122/69, weight 153 lb (69.4 kg), last menstrual period 01/18/2020. Pregravid weight 120 lb (54.4 kg) Total Weight Gain 33 lb (15 kg) Urinalysis: Urine Protein    Urine Glucose    Fetal Status: Fetal Heart Rate (bpm): 125 Fundal Height: 38 cm Movement: Present     General:  Alert, oriented and cooperative. Patient is in no acute distress.  Skin: Skin is warm and dry. No rash noted.   Cardiovascular: Normal heart rate noted  Respiratory: Normal respiratory effort, no problems with respiration noted  Abdomen: Soft, gravid, appropriate for gestational age. Pain/Pressure: Absent     Pelvic:  Cervical exam deferred        Extremities: Normal range of motion.  Edema: None  Mental Status: Normal mood and affect. Normal behavior. Normal judgment and thought content.   Assessment   30 y.o. G1P0000 at [redacted]w[redacted]d by  10/24/2020, by Last Menstrual Period presenting for routine prenatal visit  Plan   FIRST Problems (from 03/08/20 to present)    Problem Noted Resolved   Pregnancy, supervision, normal, first 03/08/2020 by Tresea Mall,  CNM No   Overview Addendum 10/03/2020  3:02 PM by Zipporah Plants, CNM    Clinic [facility] Prenatal Labs  Dating L=10 Blood type: AB/Positive/-- (05/27 1522)   Genetic Screen NIPS: diploid XX Antibody:Negative (05/27 1522)  Anatomic US  wnl Rubella: 1.39 (05/27 1522)  Varicella: immune  GTT Early:  NA              Third trimester: 91 RPR: Non Reactive (05/27 1522)   Rhogam  n/a HBsAg: Negative (05/27 1522)   Vaccines TDAP: 08/28/20                      Flu Shot: HIV: Non Reactive (05/27 1522)   Baby Food Breast                               GBS:  negative  Contraception  condoms Pap: 2019 negative  CBB     CS/VBAC NA   Support Person [name] husband          Previous Version   Breast mass, right 09/02/2018 by Carlis Stable, PA-C No   Overview Signed 09/11/2020  4:35 PM by Conard Novak, MD    [x]  negative mammo/u/s 09/11/20 at [redacted]w[redacted]d           Term labor symptoms and general obstetric precautions including but not limited to vaginal bleeding, contractions, leaking of fluid and fetal movement were reviewed in detail with the patient. Please refer to After Visit Summary for other counseling recommendations.   Return in about 1 week (around 10/18/2020) for Routine Prenatal Appointment.   12/16/2020, MD, [name] [facility], [facility] 10/11/2020 4:57 PM

## 2020-10-13 NOTE — L&D Delivery Note (Signed)
Delivery Note At 6:43 AM a viable female was delivered via Vaginal, Spontaneous (Presentation: Right Occiput Anterior).  APGAR: 9, 9; weight pending.   Placenta status: Spontaneous, Intact.  Cord: 3 vessels with the following complications: None.  Cord pH: n/a  Anesthesia: Epidural Episiotomy: None Lacerations: 2nd degree;Perineal;Labial;Vaginal Suture Repair: 3.0 vicryl Est. Blood Loss (mL):  450, QBL pending  Mom to postpartum.  Baby to Couplet care / Skin to Skin.  Called to see patient.  Mom pushed to deliver a viable female infant.  The head followed by shoulders, which delivered without difficulty, and the rest of the body.  No nuchal cord noted.  Baby to mom's chest.  Cord clamped and cut after > 1 min delay.  No cord blood obtained.  Placenta delivered spontaneously, intact, with a 3-vessel cord.  Second degree perineal, labial, vaginal laceration repaired with 3-0 Vicryl in standard fashion.  All counts correct.  Hemostasis obtained with IV pitocin and fundal massage. EBL 450 mL.     [name], MD 10/28/2020, 7:52 AM

## 2020-10-16 ENCOUNTER — Other Ambulatory Visit: Payer: Self-pay

## 2020-10-16 ENCOUNTER — Encounter: Payer: Self-pay | Admitting: Obstetrics and Gynecology

## 2020-10-16 ENCOUNTER — Ambulatory Visit (INDEPENDENT_AMBULATORY_CARE_PROVIDER_SITE_OTHER): Payer: No Typology Code available for payment source | Admitting: Obstetrics and Gynecology

## 2020-10-16 VITALS — BP 120/70 | Wt 153.0 lb

## 2020-10-16 DIAGNOSIS — Z3403 Encounter for supervision of normal first pregnancy, third trimester: Secondary | ICD-10-CM

## 2020-10-16 DIAGNOSIS — Z3A38 38 weeks gestation of pregnancy: Secondary | ICD-10-CM

## 2020-10-16 NOTE — Progress Notes (Signed)
Routine Prenatal Care Visit  Subjective  Patricia Swanson is a 30 y.o. G1P0000 at [redacted]w[redacted]d being seen today for ongoing prenatal care.  She is currently monitored for the following issues for this low-risk pregnancy and has Breast mass, right and Pregnancy, supervision, normal, first on their problem list.  ----------------------------------------------------------------------------------- Patient reports no complaints.   Contractions: Not present. Vag. Bleeding: None.  Movement: Present. Leaking Fluid denies.  ----------------------------------------------------------------------------------- The following portions of the patient's history were reviewed and updated as appropriate: allergies, current medications, past family history, past medical history, past social history, past surgical history and problem list. Problem list updated.  Objective  Blood pressure 120/70, weight 153 lb (69.4 kg), last menstrual period 01/18/2020. Pregravid weight 120 lb (54.4 kg) Total Weight Gain 33 lb (15 kg) Urinalysis: Urine Protein    Urine Glucose    Fetal Status: Fetal Heart Rate (bpm): 145 Fundal Height: 38 cm Movement: Present  Presentation: Vertex by u/s  General:  Alert, oriented and cooperative. Patient is in no acute distress.  Skin: Skin is warm and dry. No rash noted.   Cardiovascular: Normal heart rate noted  Respiratory: Normal respiratory effort, no problems with respiration noted  Abdomen: Soft, gravid, appropriate for gestational age. Pain/Pressure: Absent     Pelvic:  Cervical exam performed Dilation: 1.5 Effacement (%): 50 Station: -3  Extremities: Normal range of motion.  Edema: None  Mental Status: Normal mood and affect. Normal behavior. Normal judgment and thought content.   Assessment   30 y.o. G1P0000 at [redacted]w[redacted]d by  10/24/2020, by Last Menstrual Period presenting for routine prenatal visit  Plan   FIRST Problems (from 03/08/20 to present)    Problem Noted Resolved    Pregnancy, supervision, normal, first 03/08/2020 by Tresea Mall, CNM No   Overview Addendum 10/03/2020  3:02 PM by Zipporah Plants, CNM    [facility]inic [facility] Prenatal Labs  Dating L=10 Blood type: AB/Positive/-- (05/27 1522)   Genetic Screen NIPS: diploid XX Antibody:Negative (05/27 1522)  Anatomic US  wnl Rubella: 1.39 (05/27 1522)  Varicella: immune  GTT Early:  NA              Third trimester: 91 RPR: Non Reactive (05/27 1522)   Rhogam  n/a HBsAg: Negative (05/27 1522)   Vaccines TDAP: 08/28/20                      Flu Shot: HIV: Non Reactive (05/27 1522)   Baby Food Breast                               GBS:  negative  Contraception  condoms Pap: 2019 negative  CBB     CS/VBAC NA   Support Person [name] husband          Previous Version   Breast mass, right 09/02/2018 by Carlis Stable, PA-C No   Overview Signed 09/11/2020  4:35 PM by Conard Novak, MD    [x]  negative mammo/u/s 09/11/20 at [redacted]w[redacted]d           Term labor symptoms and general obstetric precautions including but not limited to vaginal bleeding, contractions, leaking of fluid and fetal movement were reviewed in detail with the patient. Please refer to After Visit Summary for other counseling recommendations.   Return in about 1 week (around 10/23/2020) for Routine Prenatal Appointment w Dr. 12/21/2020 (may over/double book).   Jean Rosenthal, MD, [facility],  [facility] 10/16/2020 3:33 PM

## 2020-10-22 ENCOUNTER — Other Ambulatory Visit: Payer: Self-pay

## 2020-10-22 ENCOUNTER — Encounter: Payer: Self-pay | Admitting: Obstetrics and Gynecology

## 2020-10-22 ENCOUNTER — Ambulatory Visit (INDEPENDENT_AMBULATORY_CARE_PROVIDER_SITE_OTHER): Payer: No Typology Code available for payment source | Admitting: Obstetrics and Gynecology

## 2020-10-22 VITALS — BP 126/74 | Wt 155.0 lb

## 2020-10-22 DIAGNOSIS — Z3A39 39 weeks gestation of pregnancy: Secondary | ICD-10-CM

## 2020-10-22 DIAGNOSIS — Z3403 Encounter for supervision of normal first pregnancy, third trimester: Secondary | ICD-10-CM

## 2020-10-22 NOTE — Progress Notes (Signed)
Routine Prenatal Care Visit  Subjective  Patricia Swanson is a 30 y.o. G1P0000 at [redacted]w[redacted]d being seen today for ongoing prenatal care.  She is currently monitored for the following issues for this low-risk pregnancy and has Breast mass, right and Pregnancy, supervision, normal, first on their problem list.  ----------------------------------------------------------------------------------- Patient reports no complaints.   Contractions: Not present. Vag. Bleeding: None.  Movement: Present. Leaking Fluid denies.  ----------------------------------------------------------------------------------- The following portions of the patient's history were reviewed and updated as appropriate: allergies, current medications, past family history, past medical history, past social history, past surgical history and problem list. Problem list updated.  Objective  Blood pressure 126/74, weight 155 lb (70.3 kg), last menstrual period 01/18/2020. Pregravid weight 120 lb (54.4 kg) Total Weight Gain 35 lb (15.9 kg) Urinalysis: Urine Protein    Urine Glucose    Fetal Status: Fetal Heart Rate (bpm): 145   Movement: Present  Presentation: Vertex  General:  Alert, oriented and cooperative. Patient is in no acute distress.  Skin: Skin is warm and dry. No rash noted.   Cardiovascular: Normal heart rate noted  Respiratory: Normal respiratory effort, no problems with respiration noted  Abdomen: Soft, gravid, appropriate for gestational age. Pain/Pressure: Absent     Pelvic:  Cervical exam performed Dilation: 1.5 Effacement (%): 50 Station: -2 Membrane sweep performed  Extremities: Normal range of motion.  Edema: None  Mental Status: Normal mood and affect. Normal behavior. Normal judgment and thought content.   Assessment   30 y.o. G1P0000 at [redacted]w[redacted]d by  10/24/2020, by Last Menstrual Period presenting for routine prenatal visit  Plan   FIRST Problems (from 03/08/20 to present)    Problem Noted Resolved    Pregnancy, supervision, normal, first 03/08/2020 by Tresea Mall, CNM No   Overview Addendum 10/03/2020  3:02 PM by Zipporah Plants, CNM    [facility]inic [facility] Prenatal Labs  Dating L=10 Blood type: AB/Positive/-- (05/27 1522)   Genetic Screen NIPS: diploid XX Antibody:Negative (05/27 1522)  Anatomic US  wnl Rubella: 1.39 (05/27 1522)  Varicella: immune  GTT Early:  NA              Third trimester: 91 RPR: Non Reactive (05/27 1522)   Rhogam  n/a HBsAg: Negative (05/27 1522)   Vaccines TDAP: 08/28/20                      Flu Shot: HIV: Non Reactive (05/27 1522)   Baby Food Breast                               GBS:  negative  Contraception  condoms Pap: 2019 negative  CBB     CS/VBAC NA   Support Person [name] husband          Previous Version   Breast mass, right 09/02/2018 by Carlis Stable, PA-C No   Overview Signed 09/11/2020  4:35 PM by Conard Novak, MD    [x]  negative mammo/u/s 09/11/20 at [redacted]w[redacted]d           Term labor symptoms and general obstetric precautions including but not limited to vaginal bleeding, contractions, leaking of fluid and fetal movement were reviewed in detail with the patient. Please refer to After Visit Summary for other counseling recommendations.   IOL scheduled for 10/28/20 at 0500.  Orders placed. H&P to be done day of IOL by me or when presents to L&D fo other indication.  Return in about 1 week (around 10/29/2020) for Routine Prenatal Appointment.   [name], MD, [name], [facility] 10/22/2020 2:52 PM

## 2020-10-22 NOTE — Patient Instructions (Addendum)
Induction Information: Your induction date: 10/28/20 at 0500 AM Covid test: 10/26/2020 between 9-10AM. Go to the [facility] drive-through.  Wear a mask and stay in your car.   This should not take long.  On your induction date, go to the ER a little before your induction time and let them know you're there for your labor induction.

## 2020-10-26 ENCOUNTER — Other Ambulatory Visit
Admission: RE | Admit: 2020-10-26 | Discharge: 2020-10-26 | Disposition: A | Discharge status: Home / Self Care | Payer: No Typology Code available for payment source | Source: Ambulatory Visit | Attending: Obstetrics and Gynecology | Admitting: Obstetrics and Gynecology

## 2020-10-26 ENCOUNTER — Other Ambulatory Visit: Payer: Self-pay

## 2020-10-26 DIAGNOSIS — Z01812 Encounter for preprocedural laboratory examination: Secondary | ICD-10-CM | POA: Insufficient documentation

## 2020-10-26 DIAGNOSIS — Z20822 Contact with and (suspected) exposure to covid-19: Secondary | ICD-10-CM | POA: Insufficient documentation

## 2020-10-26 LAB — SARS CORONAVIRUS 2 (TAT 6-24 HRS): SARS Coronavirus 2: NEGATIVE

## 2020-10-27 ENCOUNTER — Encounter: Payer: Self-pay | Admitting: Obstetrics and Gynecology

## 2020-10-27 ENCOUNTER — Other Ambulatory Visit: Payer: Self-pay

## 2020-10-27 ENCOUNTER — Inpatient Hospital Stay
Admission: EM | Admit: 2020-10-27 | Discharge: 2020-10-29 | DRG: 807 | Disposition: A | Discharge status: Home / Self Care | Payer: No Typology Code available for payment source | Attending: Obstetrics and Gynecology | Admitting: Obstetrics and Gynecology

## 2020-10-27 DIAGNOSIS — Z20822 Contact with and (suspected) exposure to covid-19: Secondary | ICD-10-CM | POA: Diagnosis present

## 2020-10-27 DIAGNOSIS — Z3A40 40 weeks gestation of pregnancy: Secondary | ICD-10-CM | POA: Diagnosis not present

## 2020-10-27 DIAGNOSIS — Z3400 Encounter for supervision of normal first pregnancy, unspecified trimester: Secondary | ICD-10-CM

## 2020-10-27 DIAGNOSIS — Z3403 Encounter for supervision of normal first pregnancy, third trimester: Principal | ICD-10-CM

## 2020-10-27 DIAGNOSIS — Z3490 Encounter for supervision of normal pregnancy, unspecified, unspecified trimester: Secondary | ICD-10-CM | POA: Diagnosis present

## 2020-10-27 DIAGNOSIS — N6310 Unspecified lump in the right breast, unspecified quadrant: Secondary | ICD-10-CM

## 2020-10-27 DIAGNOSIS — O26893 Other specified pregnancy related conditions, third trimester: Secondary | ICD-10-CM | POA: Diagnosis present

## 2020-10-27 DIAGNOSIS — O480 Post-term pregnancy: Secondary | ICD-10-CM | POA: Diagnosis not present

## 2020-10-27 LAB — CBC
HCT: 35.4 % — ABNORMAL LOW (ref 36.0–46.0)
Hemoglobin: 12 g/dL (ref 12.0–15.0)
MCH: 31.7 pg (ref 26.0–34.0)
MCHC: 33.9 g/dL (ref 30.0–36.0)
MCV: 93.7 fL (ref 80.0–100.0)
Platelets: 214 10*3/uL (ref 150–400)
RBC: 3.78 MIL/uL — ABNORMAL LOW (ref 3.87–5.11)
RDW: 13.4 % (ref 11.5–15.5)
WBC: 9.4 10*3/uL (ref 4.0–10.5)
nRBC: 0 % (ref 0.0–0.2)

## 2020-10-27 LAB — TYPE AND SCREEN
ABO/RH(D): AB POS
Antibody Screen: NEGATIVE

## 2020-10-27 LAB — ABO/RH: ABO/RH(D): AB POS

## 2020-10-27 MED ORDER — SODIUM CHLORIDE (PF) 0.9 % IJ SOLN
INTRAMUSCULAR | Status: AC
  Filled 2020-10-27: qty 50

## 2020-10-27 MED ORDER — OXYTOCIN-SODIUM CHLORIDE 30-0.9 UT/500ML-% IV SOLN: 2.5000 [IU]/h | INTRAVENOUS | Status: DC

## 2020-10-27 MED ORDER — OXYTOCIN 10 UNIT/ML IJ SOLN: 10.0000 [IU] | Freq: Once | INTRAMUSCULAR | Status: DC

## 2020-10-27 MED ORDER — SOD CITRATE-CITRIC ACID 500-334 MG/5ML PO SOLN: 30.0000 mL | ORAL | Status: DC | PRN

## 2020-10-27 MED ORDER — OXYTOCIN BOLUS FROM INFUSION
333.0000 mL | Freq: Once | INTRAVENOUS | Status: AC
  Administered 2020-10-28: 333 mL via INTRAVENOUS

## 2020-10-27 MED ORDER — HYDROMORPHONE HCL 1 MG/ML IJ SOLN
0.5000 mg | INTRAMUSCULAR | Status: DC | PRN
  Administered 2020-10-28 (×2): 0.5 mg via INTRAVENOUS
  Filled 2020-10-27: qty 1

## 2020-10-27 MED ORDER — LACTATED RINGERS IV SOLN: INTRAVENOUS | Status: DC

## 2020-10-27 MED ORDER — LIDOCAINE HCL (PF) 1 % IJ SOLN
INTRAMUSCULAR | Status: AC
  Filled 2020-10-27: qty 30

## 2020-10-27 MED ORDER — LIDOCAINE HCL (PF) 1 % IJ SOLN: 30.0000 mL | INTRAMUSCULAR | Status: DC | PRN

## 2020-10-27 MED ORDER — OXYTOCIN-SODIUM CHLORIDE 30-0.9 UT/500ML-% IV SOLN
INTRAVENOUS | Status: AC
  Filled 2020-10-27: qty 1000

## 2020-10-27 MED ORDER — AMMONIA AROMATIC IN INHA
RESPIRATORY_TRACT | Status: AC
  Filled 2020-10-27: qty 10

## 2020-10-27 MED ORDER — LACTATED RINGERS IV SOLN: 500.0000 mL | INTRAVENOUS | Status: DC | PRN

## 2020-10-27 MED ORDER — HYDROMORPHONE HCL 1 MG/ML IJ SOLN
INTRAMUSCULAR | Status: AC
  Filled 2020-10-27: qty 1

## 2020-10-27 MED ORDER — OXYTOCIN 10 UNIT/ML IJ SOLN
INTRAMUSCULAR | Status: AC
  Filled 2020-10-27: qty 2

## 2020-10-27 MED ORDER — TERBUTALINE SULFATE 1 MG/ML IJ SOLN: 0.2500 mg | Freq: Once | INTRAMUSCULAR | Status: DC | PRN

## 2020-10-27 MED ORDER — MISOPROSTOL 25 MCG QUARTER TABLET
25.0000 ug | ORAL_TABLET | ORAL | Status: DC | PRN
  Filled 2020-10-27: qty 1

## 2020-10-27 MED ORDER — ONDANSETRON HCL 4 MG/2ML IJ SOLN
4.0000 mg | Freq: Four times a day (QID) | INTRAMUSCULAR | Status: DC | PRN
  Administered 2020-10-28: 4 mg via INTRAVENOUS
  Filled 2020-10-27: qty 2

## 2020-10-27 MED ORDER — MISOPROSTOL 200 MCG PO TABS
ORAL_TABLET | ORAL | Status: AC
  Administered 2020-10-27: 25 ug via VAGINAL
  Filled 2020-10-27: qty 4

## 2020-10-27 NOTE — H&P (Signed)
OB History & Physical   History of Present Illness:  Chief Complaint: here for induction of labor  HPI:  Patricia Swanson is a 30 y.o. G1P0000 female at [redacted]w[redacted]d dated by LMP consistent with a 10 week u/s.  Her pregnancy has been uncomplicated.    She denies contractions.   She denies leakage of fluid.   She denies vaginal bleeding.   She reports fetal movement.    Total weight gain for pregnancy: 15.9 kg   Obstetrical Problem List: FIRST Problems (from 03/08/20 to present)    Problem Noted Resolved   Pregnancy, supervision, normal, first 03/08/2020 by Tresea Mall, CNM No   Overview Addendum 10/03/2020  3:02 PM by Zipporah Plants, CNM    [facility]inic [facility] Prenatal Labs  Dating L=10 Blood type: AB/Positive/-- (05/27 1522)   Genetic Screen NIPS: diploid XX Antibody:Negative (05/27 1522)  Anatomic US  wnl Rubella: 1.39 (05/27 1522)  Varicella: immune  GTT Early:  NA              Third trimester: 91 RPR: Non Reactive (05/27 1522)   Rhogam  n/a HBsAg: Negative (05/27 1522)   Vaccines TDAP: 08/28/20                      Flu Shot: HIV: Non Reactive (05/27 1522)   Baby Food Breast                               GBS:  negative  Contraception  condoms Pap: 2019 negative  CBB     CS/VBAC NA   Support Person Patricia Swanson          Previous Version   Breast mass, right 09/02/2018 by Carlis Stable, PA-C No   Overview Signed 09/11/2020  4:35 PM by Conard Novak, MD    [x]  negative mammo/u/s 09/11/20 at [redacted]w[redacted]d           Maternal Medical History:  History reviewed. No pertinent past medical history.  Past Surgical History:  Procedure Laterality Date  . NO PAST SURGERIES      No Known Allergies  Prior to Admission medications   Medication Sig Start Date End Date Taking? Authorizing Provider  omeprazole (PRILOSEC) 20 MG capsule Take 20 mg by mouth daily.   Yes [provider]  Prenatal Vit-Fe Fumarate-FA (PRENATAL VITAMIN PO) Take 1 tablet by mouth  daily.   Yes [provider]    OB History  Gravida Para Term Preterm AB Living  1 0 0 0 0 0  SAB IAB Ectopic Multiple Live Births  0 0 0 0      # Outcome Date GA Lbr Len/2nd Weight Sex Delivery Anes PTL Lv  1 Current             Prenatal care site: [facility]  Social History: She  reports that she has never smoked. She has never used smokeless tobacco. She reports previous alcohol use of about 1.0 standard drink of alcohol per week. She reports that she does not use drugs.  Family History: family history includes Diabetes in her maternal grandmother and paternal grandmother.   Review of Systems:  Review of Systems  Constitutional: Negative.   HENT: Negative.   Eyes: Negative.   Respiratory: Negative.   Cardiovascular: Negative.   Gastrointestinal: Negative.   Genitourinary: Negative.   Musculoskeletal: Negative.   Skin: Negative.   Neurological: Negative.   Psychiatric/Behavioral:  Negative.      Physical Exam:  BP 133/77   Pulse 78   Temp 98.6 F (37 C) (Oral)   Resp 18   Ht 5\' 6"  (1.676 m)   Wt 70.3 kg   LMP 01/18/2020 (Exact Date)   BMI 25.02 kg/m   Physical Exam Constitutional:      General: She is not in acute distress.    Appearance: Normal appearance. She is well-developed.  HENT:     Head: Normocephalic and atraumatic.  Eyes:     General: No scleral icterus.    Conjunctiva/sclera: Conjunctivae normal.  Cardiovascular:     Rate and Rhythm: Normal rate and regular rhythm.     Heart sounds: No murmur heard. No friction rub. No gallop.   Pulmonary:     Effort: Pulmonary effort is normal. No respiratory distress.     Breath sounds: Normal breath sounds. No wheezing or rales.  Abdominal:     General: Bowel sounds are normal. There is no distension.     Palpations: Abdomen is soft. There is mass (gravid, NT).     Tenderness: There is no abdominal tenderness. There is no guarding or rebound.  Musculoskeletal:        General: Normal range  of motion.     Cervical back: Normal range of motion and neck supple.  Neurological:     General: No focal deficit present.     Mental Status: She is alert and oriented to person, place, and time.     Cranial Nerves: No cranial nerve deficit.  Skin:    General: Skin is warm and dry.     Findings: No erythema.  Psychiatric:        Mood and Affect: Mood normal.        Behavior: Behavior normal.        Judgment: Judgment normal.     Baseline FHR: 145 beats/min   Variability: moderate   Accelerations: present   Decelerations: absent Contractions: present frequency: irritability Overall assessment: cat 1  Lab Results  Component Value Date   SARSCOV2NAA NEGATIVE 10/26/2020    Assessment:  Patricia Swanson is a 30 y.o. G1P0000 female at [redacted]w[redacted]d with elective IOL.   Plan:  1. Admit to Labor & Delivery  2. CBC, T&S, Clrs, IVF 3. GBS negative on 09/28/20.   4. Fetwal well-being: reassuring, cat 1 5. Induction with misoprostol and foley balloon.  Pitocin and AROM when/if appropriate.    09/30/20, MD 10/27/2020 9:18 PM

## 2020-10-28 ENCOUNTER — Inpatient Hospital Stay: Payer: No Typology Code available for payment source | Admitting: Anesthesiology

## 2020-10-28 ENCOUNTER — Encounter: Payer: Self-pay | Admitting: Obstetrics and Gynecology

## 2020-10-28 DIAGNOSIS — O480 Post-term pregnancy: Secondary | ICD-10-CM | POA: Diagnosis not present

## 2020-10-28 DIAGNOSIS — Z3A40 40 weeks gestation of pregnancy: Secondary | ICD-10-CM

## 2020-10-28 LAB — RPR: RPR Ser Ql: NONREACTIVE

## 2020-10-28 MED ORDER — PRENATAL MULTIVITAMIN CH
1.0000 | ORAL_TABLET | Freq: Every day | ORAL | Status: DC
  Administered 2020-10-28 – 2020-10-29 (×2): 1 via ORAL
  Filled 2020-10-28 (×2): qty 1

## 2020-10-28 MED ORDER — EPHEDRINE 5 MG/ML INJ: 10.0000 mg | INTRAVENOUS | Status: DC | PRN

## 2020-10-28 MED ORDER — HYDROCODONE-ACETAMINOPHEN 5-325 MG PO TABS: 1.0000 | ORAL_TABLET | Freq: Four times a day (QID) | ORAL | Status: DC | PRN

## 2020-10-28 MED ORDER — ACETAMINOPHEN 325 MG PO TABS
650.0000 mg | ORAL_TABLET | ORAL | Status: DC | PRN
  Administered 2020-10-28 (×2): 650 mg via ORAL
  Filled 2020-10-28 (×2): qty 2

## 2020-10-28 MED ORDER — FENTANYL 2.5 MCG/ML W/ROPIVACAINE 0.15% IN NS 100 ML EPIDURAL (ARMC)
EPIDURAL | Status: DC | PRN
  Administered 2020-10-28: 12 mL/h via EPIDURAL

## 2020-10-28 MED ORDER — COCONUT OIL OIL
1.0000 "application " | TOPICAL_OIL | Status: DC | PRN
  Administered 2020-10-28: 1 via TOPICAL
  Filled 2020-10-28: qty 120

## 2020-10-28 MED ORDER — TERBUTALINE SULFATE 1 MG/ML IJ SOLN: 0.2500 mg | Freq: Once | INTRAMUSCULAR | Status: DC | PRN

## 2020-10-28 MED ORDER — BENZOCAINE-MENTHOL 20-0.5 % EX AERO
1.0000 "application " | INHALATION_SPRAY | CUTANEOUS | Status: DC | PRN
  Administered 2020-10-28: 1 via TOPICAL
  Filled 2020-10-28 (×2): qty 56

## 2020-10-28 MED ORDER — ONDANSETRON HCL 4 MG PO TABS: 4.0000 mg | ORAL_TABLET | ORAL | Status: DC | PRN

## 2020-10-28 MED ORDER — IBUPROFEN 600 MG PO TABS
ORAL_TABLET | ORAL | Status: AC
  Administered 2020-10-28: 600 mg via ORAL
  Filled 2020-10-28: qty 1

## 2020-10-28 MED ORDER — SENNOSIDES-DOCUSATE SODIUM 8.6-50 MG PO TABS
2.0000 | ORAL_TABLET | ORAL | Status: DC
  Administered 2020-10-28 – 2020-10-29 (×2): 2 via ORAL
  Filled 2020-10-28 (×2): qty 2

## 2020-10-28 MED ORDER — PHENYLEPHRINE 40 MCG/ML (10ML) SYRINGE FOR IV PUSH (FOR BLOOD PRESSURE SUPPORT): 80.0000 ug | PREFILLED_SYRINGE | INTRAVENOUS | Status: DC | PRN

## 2020-10-28 MED ORDER — FERROUS SULFATE 325 (65 FE) MG PO TABS
325.0000 mg | ORAL_TABLET | Freq: Two times a day (BID) | ORAL | Status: DC
  Administered 2020-10-28: 325 mg via ORAL
  Filled 2020-10-28: qty 1

## 2020-10-28 MED ORDER — LIDOCAINE-EPINEPHRINE (PF) 1.5 %-1:200000 IJ SOLN
INTRAMUSCULAR | Status: DC | PRN
  Administered 2020-10-28: 3 mL via EPIDURAL

## 2020-10-28 MED ORDER — DIPHENHYDRAMINE HCL 50 MG/ML IJ SOLN: 12.5000 mg | INTRAMUSCULAR | Status: DC | PRN

## 2020-10-28 MED ORDER — ONDANSETRON HCL 4 MG/2ML IJ SOLN: 4.0000 mg | INTRAMUSCULAR | Status: DC | PRN

## 2020-10-28 MED ORDER — SIMETHICONE 80 MG PO CHEW: 80.0000 mg | CHEWABLE_TABLET | ORAL | Status: DC | PRN

## 2020-10-28 MED ORDER — LACTATED RINGERS IV SOLN
500.0000 mL | Freq: Once | INTRAVENOUS | Status: AC
  Administered 2020-10-28: 500 mL via INTRAVENOUS

## 2020-10-28 MED ORDER — FENTANYL 2.5 MCG/ML W/ROPIVACAINE 0.15% IN NS 100 ML EPIDURAL (ARMC)
EPIDURAL | Status: AC
  Filled 2020-10-28: qty 100

## 2020-10-28 MED ORDER — DIPHENHYDRAMINE HCL 25 MG PO CAPS: 25.0000 mg | ORAL_CAPSULE | Freq: Four times a day (QID) | ORAL | Status: DC | PRN

## 2020-10-28 MED ORDER — OXYTOCIN-SODIUM CHLORIDE 30-0.9 UT/500ML-% IV SOLN: 1.0000 m[IU]/min | INTRAVENOUS | Status: DC

## 2020-10-28 MED ORDER — DIBUCAINE (PERIANAL) 1 % EX OINT
1.0000 "application " | TOPICAL_OINTMENT | CUTANEOUS | Status: DC | PRN
  Administered 2020-10-28: 1 via RECTAL
  Filled 2020-10-28 (×2): qty 28

## 2020-10-28 MED ORDER — LIDOCAINE HCL (PF) 1 % IJ SOLN
INTRAMUSCULAR | Status: DC | PRN
  Administered 2020-10-28: 1.5 mL

## 2020-10-28 MED ORDER — IBUPROFEN 600 MG PO TABS
600.0000 mg | ORAL_TABLET | Freq: Four times a day (QID) | ORAL | Status: DC
  Administered 2020-10-28 – 2020-10-29 (×4): 600 mg via ORAL
  Filled 2020-10-28 (×4): qty 1

## 2020-10-28 MED ORDER — FENTANYL 2.5 MCG/ML W/ROPIVACAINE 0.15% IN NS 100 ML EPIDURAL (ARMC): 12.0000 mL/h | EPIDURAL | Status: DC

## 2020-10-28 MED ORDER — WITCH HAZEL-GLYCERIN EX PADS
1.0000 "application " | MEDICATED_PAD | CUTANEOUS | Status: DC | PRN
  Filled 2020-10-28 (×2): qty 100

## 2020-10-28 NOTE — Anesthesia Preprocedure Evaluation (Signed)
Anesthesia Evaluation  Patient identified by MRN, date of birth, ID band Patient awake    Reviewed: Allergy & Precautions, NPO status , Patient's Chart, lab work & pertinent test results  History of Anesthesia Complications Negative for: history of anesthetic complications  Airway Mallampati: II       Dental   Pulmonary neg sleep apnea, neg COPD, Not current smoker,           Cardiovascular (-) hypertension(-) Past MI and (-) CHF (-) dysrhythmias (-) Valvular Problems/Murmurs     Neuro/Psych neg Seizures    GI/Hepatic Neg liver ROS, GERD (with pregnany)  Medicated,  Endo/Other  neg diabetes  Renal/GU negative Renal ROS     Musculoskeletal   Abdominal   Peds  Hematology   Anesthesia Other Findings   Reproductive/Obstetrics                             Anesthesia Physical Anesthesia Plan  ASA: II  Anesthesia Plan: Epidural   Post-op Pain Management:    Induction:   PONV Risk Score and Plan:   Airway Management Planned:   Additional Equipment:   Intra-op Plan:   Post-operative Plan:   Informed Consent: I have reviewed the patients History and Physical, chart, labs and discussed the procedure including the risks, benefits and alternatives for the proposed anesthesia with the patient or authorized representative who has indicated his/her understanding and acceptance.       Plan Discussed with:   Anesthesia Plan Comments:         Anesthesia Quick Evaluation

## 2020-10-28 NOTE — Discharge Summary (Signed)
Postpartum Discharge Summary   Patient Name: Patricia Swanson DOB: 1991-09-23 MRN: [id number]  Date of admission: 10/27/2020 Delivery date:10/28/2020  Delivering provider: Prentice Docker D  Date of discharge: 10/29/2020  Admitting diagnosis: Encounter for elective induction of labor [Z34.90] Intrauterine pregnancy: [redacted]w[redacted]d    Secondary diagnosis:  Active Problems:   Pregnancy, supervision, normal, first   Encounter for elective induction of labor   [redacted] weeks gestation of pregnancy  Additional problems: none    Discharge diagnosis: Term Pregnancy Delivered                                              Post partum procedures:none Augmentation: Cytotec and IP Foley Complications: None  Hospital course: Induction of Labor With Vaginal Delivery   30y.o. yo G1P0000 at 440w4das admitted to the hospital 10/27/2020 for induction of labor.  Indication for induction: Elective.  Patient had an uncomplicated labor course as follows: Membrane Rupture Time/Date: 5:40 AM ,10/28/2020   Delivery Method:Vaginal, Spontaneous  Episiotomy: None  Lacerations:  2nd degree;Perineal;Labial;Vaginal  Details of delivery can be found in separate delivery note.  Patient had a routine postpartum course. Patient is discharged home 10/29/20.  Newborn Data: Birth date:10/28/2020  Birth time:6:43 AM  Gender:Female  Living status:Living  Apgars:9 ,9  Weight:3390 g   Magnesium Sulfate received: No BMZ received: No Rhophylac:No MMR:No T-DaP:Given prenatally on 08/28/2020 Flu: No Transfusion:No  Physical exam  Vitals:   10/28/20 1250 10/28/20 1559 10/28/20 1956 10/28/20 2334  BP: 99/84 104/61 108/63 111/69  Pulse: 77 78 79 89  Resp: 18 20 18 18   Temp: 98.8 F (37.1 C) 98.9 F (37.2 C) (!) 97.5 F (36.4 C) 98.2 F (36.8 C)  TempSrc: Oral Oral Oral Oral  SpO2: 98% 99% 100% 98%  Weight:      Height:       General: alert, cooperative and no distress Lochia: appropriate Uterine Fundus:  firm Incision: N/A DVT Evaluation: No evidence of DVT seen on physical exam. No significant calf/ankle edema. Labs: Lab Results  Component Value Date   WBC 11.8 (H) 10/29/2020   HGB 10.2 (L) 10/29/2020   HCT 30.4 (L) 10/29/2020   MCV 94.4 10/29/2020   PLT 171 10/29/2020   CMP Latest Ref Rng & Units 05/03/2015  Glucose 65 - 99 mg/dL 108(H)  BUN 6 - 20 mg/dL 11  Creatinine 0.44 - 1.00 mg/dL 0.82  Sodium 135 - 145 mmol/L 138  Potassium 3.5 - 5.1 mmol/L 4.0  Chloride 101 - 111 mmol/L 103  CO2 22 - 32 mmol/L 26  Calcium 8.9 - 10.3 mg/dL 9.8  Total Protein 6.5 - 8.1 g/dL 7.7  Total Bilirubin 0.3 - 1.2 mg/dL 0.5  Alkaline Phos 38 - 126 U/L 50  AST 15 - 41 U/L 19  ALT 14 - 54 U/L 13(L)   Edinburgh Score: Edinburgh Postnatal Depression Scale Screening Tool 10/28/2020  I have been able to laugh and see the funny side of things. 0  I have looked forward with enjoyment to things. 0  I have blamed myself unnecessarily when things went wrong. 0  I have been anxious or worried for no good reason. 0  I have felt scared or panicky for no good reason. 0  Things have been getting on top of me. 1  I have been so unhappy that I have had  difficulty sleeping. 0  I have felt sad or miserable. 0  I have been so unhappy that I have been crying. 0  The thought of harming myself has occurred to me. 0  Edinburgh Postnatal Depression Scale Total 1      After visit meds:  Allergies as of 10/29/2020   No Known Allergies     Medication List    STOP taking these medications   omeprazole 20 MG capsule Commonly known as: PRILOSEC     TAKE these medications   acetaminophen 325 MG tablet Commonly known as: Tylenol Take 2 tablets (650 mg total) by mouth every 4 (four) hours as needed (for pain scale < 4).   ibuprofen 600 MG tablet Commonly known as: ADVIL Take 1 tablet (600 mg total) by mouth every 6 (six) hours.   PRENATAL VITAMIN PO Take 1 tablet by mouth daily.        Discharge home  in stable condition Infant Feeding: Breast Infant Disposition:home with mother Discharge instruction: per After Visit Summary and Postpartum booklet. Activity: Advance as tolerated. Pelvic rest for 6 weeks.  Diet: routine diet Anticipated Birth Control: Condoms Postpartum Appointment:6 weeks Additional Postpartum F/U: none Future Appointments:No future appointments. Follow up Visit:  Follow-up Information    Will Bonnet, MD. Schedule an appointment as soon as possible for a visit in 6 week(s).   Specialty: Obstetrics and Gynecology Why: Six weeks postpartum Contact information: [address] [place] [address] [phone]               SIGNED:  Orlie Pollen, CNM, MSN

## 2020-10-28 NOTE — Discharge Instructions (Signed)
Discharge Instructions:   Follow-up Appointment: Call ASAP and schedule a follow-up appointment for a visit with Dr. Jean Rosenthal in 6 weeks!   If there are any new medications, they have been ordered and will be available for pickup at the listed pharmacy on your way home from the hospital.   Call office if you have any of the following: headache, visual changes, fever >101.0 F, chills, shortness of breath, breast concerns, excessive vaginal bleeding, incision drainage or problems, leg pain or redness, depression or any other concerns. If you have vaginal discharge with an odor, let your doctor know.   It is normal to bleed for up to 6 weeks. You should not soak through more than 1 pad in 1 hour. If you have a blood clot larger than your fist with continued bleeding, call your doctor.   Activity: Do not lift > 10 lbs for 6 weeks (do not lift anything heavier than your baby). No intercourse, tampons, swimming pools, hot tubs, baths (only showers) for 6 weeks.  No driving for 1-2 weeks. Continue prenatal vitamin, especially if breastfeeding. Increase calories and fluids (water) while breastfeeding.   Your milk will come in, in the next couple of days (right now it is colostrum). You may have a slight fever when your milk comes in, but it should go away on its own.  If it does not, and rises above 101 F please call the doctor. You will also feel achy and your breasts will be firm. They will also start to leak. If you are breastfeeding, continue as you have been and you can pump/express milk for comfort.   If you have too much milk, your breasts can become engorged, which could lead to mastitis. This is an infection of the milk ducts. It can be very painful and you will need to notify your doctor to obtain a prescription for antibiotics. You can also treat it with a shower or hot/cold compress.   For concerns about your baby, please call your pediatrician.  For breastfeeding concerns, the lactation  consultant can be reached at [phone].   Postpartum blues (feelings of happy one minute and sad another minute) are normal for the first few weeks but if it gets worse let your doctor know.   Congratulations! We enjoyed caring for you and your new bundle of joy!

## 2020-10-28 NOTE — Anesthesia Procedure Notes (Signed)
Epidural Patient location during procedure: OB Start time: 10/28/2020 3:15 AM End time: 10/28/2020 3:36 AM  Staffing Performed: anesthesiologist   Preanesthetic Checklist Completed: patient identified, IV checked, site marked, risks and benefits discussed, surgical consent, monitors and equipment checked, pre-op evaluation and timeout performed  Epidural Patient position: sitting Prep: Betadine Patient monitoring: heart rate, continuous pulse ox and blood pressure Approach: midline Location: L4-L5 Injection technique: LOR saline  Needle:  Needle type: Tuohy  Needle gauge: 17 G Needle length: 9 cm and 9 Needle insertion depth: 6 cm Catheter type: closed end flexible Catheter size: 20 Guage Catheter at skin depth: 13 cm Test dose: negative and 1.5% lidocaine with Epi 1:200 K  Assessment Events: blood not aspirated, injection not painful, no injection resistance, no paresthesia and negative IV test  Additional Notes   Patient tolerated the insertion well without complications.Reason for block:procedure for pain

## 2020-10-28 NOTE — Plan of Care (Signed)
?  Problem: Education: ?Goal: Knowledge of General Education information will improve ?Description: Including pain rating scale, medication(s)/side effects and non-pharmacologic comfort measures ?Outcome: Progressing ?  ?Problem: Clinical Measurements: ?Goal: Ability to maintain clinical measurements within normal limits will improve ?Outcome: Progressing ?  ?Problem: Elimination: ?Goal: Will not experience complications related to bowel motility ?Outcome: Progressing ?  ?Problem: Pain Managment: ?Goal: General experience of comfort will improve ?Outcome: Progressing ?  ?

## 2020-10-29 LAB — CBC
HCT: 30.4 % — ABNORMAL LOW (ref 36.0–46.0)
Hemoglobin: 10.2 g/dL — ABNORMAL LOW (ref 12.0–15.0)
MCH: 31.7 pg (ref 26.0–34.0)
MCHC: 33.6 g/dL (ref 30.0–36.0)
MCV: 94.4 fL (ref 80.0–100.0)
Platelets: 171 10*3/uL (ref 150–400)
RBC: 3.22 MIL/uL — ABNORMAL LOW (ref 3.87–5.11)
RDW: 13.4 % (ref 11.5–15.5)
WBC: 11.8 10*3/uL — ABNORMAL HIGH (ref 4.0–10.5)
nRBC: 0 % (ref 0.0–0.2)

## 2020-10-29 MED ORDER — IBUPROFEN 600 MG PO TABS: 600.0000 mg | ORAL_TABLET | Freq: Four times a day (QID) | ORAL | 0 refills | Status: DC

## 2020-10-29 MED ORDER — ACETAMINOPHEN 325 MG PO TABS: 650.0000 mg | ORAL_TABLET | ORAL | Status: DC | PRN

## 2020-10-29 NOTE — Anesthesia Postprocedure Evaluation (Signed)
Anesthesia Post Note  Patient: Patricia Swanson  Procedure(s) Performed: AN AD HOC LABOR EPIDURAL  Patient location during evaluation: Mother Baby Anesthesia Type: Epidural Level of consciousness: oriented and awake and alert Pain management: pain level controlled Vital Signs Assessment: post-procedure vital signs reviewed and stable Respiratory status: spontaneous breathing and respiratory function stable Cardiovascular status: blood pressure returned to baseline and stable Postop Assessment: no headache, no backache, no apparent nausea or vomiting and able to ambulate Anesthetic complications: no   No complications documented.   Last Vitals:  Vitals:   10/28/20 1956 10/28/20 2334  BP: 108/63 111/69  Pulse: 79 89  Resp: 18 18  Temp: (!) 36.4 C 36.8 C  SpO2: 100% 98%    Last Pain:  Vitals:   10/28/20 2334  TempSrc: Oral  PainSc:                  Starling Manns

## 2020-10-29 NOTE — Progress Notes (Signed)
Patient discharged home with infant. Discharge instructions and prescriptions given and reviewed with patient. Patient verbalized understanding. Escorted out by auxillary.   Pt provided with breast pump ([employer] Employee) prior to discharge, form turned in to lactation office.

## 2020-10-31 ENCOUNTER — Telehealth: Payer: Self-pay

## 2020-10-31 NOTE — Telephone Encounter (Signed)
Patient delivered 10/28/20. She had a second degree tear. She had some yellow on her pad and wanted to be sure this was ok. Also has questions about her iron supplement. [id number]#[phone]

## 2020-10-31 NOTE — Telephone Encounter (Signed)
Spoke w/patient. Advised on yellow can be normal exudate (scabbing) of tear. Monitor area for swelling, redness, green odorous drainage. Ok to do sitz bath to clean, pat dry/keep dry and apply cream as needed. Patient was on iron prior to delivery d/t anemia. It wasn't on her d/c papers. Advised to continue until 6 wk ppc when we will reassess.

## 2020-12-11 ENCOUNTER — Encounter: Payer: Self-pay | Admitting: Obstetrics and Gynecology

## 2020-12-11 ENCOUNTER — Other Ambulatory Visit: Payer: Self-pay

## 2020-12-11 ENCOUNTER — Ambulatory Visit (INDEPENDENT_AMBULATORY_CARE_PROVIDER_SITE_OTHER): Payer: No Typology Code available for payment source | Admitting: Obstetrics and Gynecology

## 2020-12-11 NOTE — Progress Notes (Signed)
Postpartum Visit  Chief Complaint:  Chief Complaint  Patient presents with  . Postpartum Care    History of Present Illness: Patient is a 30 y.o. G1P1001 presents for postpartum visit.  Date of delivery: 10/28/2020 Type of delivery: Vaginal delivery - Vacuum or forceps assisted  no Episiotomy No.  Laceration: 2nd degree Pregnancy or labor problems:   Any problems since the delivery:  no  Newborn Details:  SINGLETON :  1. Baby's name: Saylor. Birth weight: 7.6lb Maternal Details:  Breast Feeding:  Breast and Bottle Post partum depression/anxiety noted:  no Edinburgh Post-Partum Depression Score:  1  Date of last PAP: 09/02/2018-normal  Past Medical History:  Diagnosis Date  . No known health problems     Past Surgical History:  Procedure Laterality Date  . NO PAST SURGERIES      Prior to Admission medications   Medication Sig Start Date End Date Taking? Authorizing Provider  acetaminophen (TYLENOL) 325 MG tablet Take 2 tablets (650 mg total) by mouth every 4 (four) hours as needed (for pain scale < 4). 10/29/20   Zipporah Plants, CNM  ibuprofen (ADVIL) 600 MG tablet Take 1 tablet (600 mg total) by mouth every 6 (six) hours. 10/29/20   Zipporah Plants, CNM  Prenatal Vit-Fe Fumarate-FA (PRENATAL VITAMIN PO) Take 1 tablet by mouth daily.    [provider]   Allergies: No Known Allergies   Social History   Socioeconomic History  . Marital status: Married    Spouse name: N/A  . Number of children: 0  . Years of education: Not on file  . Highest education level: Not on file  Occupational History  . Occupation: Student    Comment: [employer]  Tobacco Use  . Smoking status: Never Smoker  . Smokeless tobacco: Never Used  Vaping Use  . Vaping Use: Never used  Substance and Sexual Activity  . Alcohol use: Not Currently    Alcohol/week: 1.0 standard drink    Types: 1 Standard drinks or equivalent per week  . Drug use: Never  . Sexual activity: Yes     Partners: Male    Birth control/protection: Condom  Other Topics Concern  . 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  Hopes to become a Marine scientist. Lives off campus with a roommate. Family lives in [place], [place].   Social Determinants of Health   Financial Resource Strain: Not on file  Food Insecurity: Not on file  Transportation Needs: Not on file  Physical Activity: Not on file  Stress: Not on file  Social Connections: Not on file  Intimate Partner Violence: Not on file    Family History  Problem Relation Age of Onset  . Diabetes Maternal Grandmother   . Diabetes Paternal Grandmother   . Breast cancer Neg Hx     Review of Systems  Constitutional: Negative.   HENT: Negative.   Eyes: Negative.   Respiratory: Negative.   Cardiovascular: Negative.   Gastrointestinal: Negative.   Genitourinary: Negative.   Musculoskeletal: Negative.   Skin: Negative.   Neurological: Negative.   Psychiatric/Behavioral: Negative.      Physical Exam BP 118/74   Wt 129 lb (58.5 kg)   BMI 20.82 kg/m   Physical Exam Constitutional:      General: She is not in acute distress.    Appearance: Normal appearance. She is well-developed.  Genitourinary:     Vulva, bladder and urethral meatus normal.     Right Labia: No rash, tenderness,  lesions, skin changes or Bartholin's cyst.    Left Labia: No tenderness, skin changes, Bartholin's cyst or rash.    No inguinal adenopathy present in the right or left side.    Pelvic Tanner Score: 5/5.     Right Adnexa: not tender, not full and no mass present.    Left Adnexa: not tender, not full and no mass present.    No cervical motion tenderness, friability, lesion or polyp.     Uterus is not enlarged, fixed or tender.     Uterus is anteverted.     No urethral tenderness or mass present.     Pelvic exam was performed with patient in the lithotomy position.  HENT:     Head: Normocephalic and atraumatic.  Eyes:      General: No scleral icterus.    Conjunctiva/sclera: Conjunctivae normal.  Cardiovascular:     Rate and Rhythm: Normal rate and regular rhythm.     Heart sounds: No murmur heard. No friction rub. No gallop.   Pulmonary:     Effort: Pulmonary effort is normal. No respiratory distress.     Breath sounds: Normal breath sounds. No wheezing or rales.  Abdominal:     General: Bowel sounds are normal. There is no distension.     Palpations: Abdomen is soft. There is no mass.     Tenderness: There is no abdominal tenderness. There is no guarding or rebound.     Hernia: There is no hernia in the left inguinal area or right inguinal area.  Musculoskeletal:        General: Normal range of motion.     Cervical back: Normal range of motion and neck supple.  Lymphadenopathy:     Lower Body: No right inguinal adenopathy. No left inguinal adenopathy.  Neurological:     General: No focal deficit present.     Mental Status: She is alert and oriented to person, place, and time.     Cranial Nerves: No cranial nerve deficit.  Skin:    General: Skin is warm and dry.     Findings: No erythema.  Psychiatric:        Mood and Affect: Mood normal.        Behavior: Behavior normal.        Judgment: Judgment normal.      Female Chaperone present during breast and/or pelvic exam.  Assessment: 30 y.o. G1P1001 presenting for 6 week postpartum visit  Plan: Problem List Items Addressed This Visit   None   Visit Diagnoses    Postpartum care and examination    -  Primary     1) Contraception Education given regarding options for contraception, including barrier methods.  2)  Pap - ASCCP guidelines and rational discussed.  Patient opts for routine screening interval  3) Patient underwent screening for postpartum depression with no concerns noted.  4) Follow up 1 year for routine annual exam  [name], MD 12/11/2020 1:47 PM

## 2021-06-22 ENCOUNTER — Telehealth: Payer: No Typology Code available for payment source | Admitting: Nurse Practitioner

## 2021-06-22 DIAGNOSIS — J00 Acute nasopharyngitis [common cold]: Secondary | ICD-10-CM

## 2021-06-22 DIAGNOSIS — J029 Acute pharyngitis, unspecified: Secondary | ICD-10-CM | POA: Diagnosis not present

## 2021-06-22 MED ORDER — FLUTICASONE PROPIONATE 50 MCG/ACT NA SUSP: 2.0000 | Freq: Every day | NASAL | 6 refills | Status: DC

## 2021-06-22 NOTE — Progress Notes (Signed)

## 2022-03-13 ENCOUNTER — Encounter: Payer: No Typology Code available for payment source | Admitting: Medical-Surgical

## 2022-04-20 NOTE — Progress Notes (Deleted)
   Complete physical exam  Patient: Patricia Swanson   DOB: July 16, 1991   31 y.o. Female  MRN: [id number]  Subjective:    No chief complaint on file.   Patricia Swanson is a 31 y.o. female who presents today for a complete physical exam. She reports consuming a {diet types:17450} diet. {types:19826} She generally feels {DESC; WELL/FAIRLY WELL/POORLY:18703}. She reports sleeping {DESC; WELL/FAIRLY WELL/POORLY:18703}. She {does/does not:200015} have additional problems to discuss today.    Most recent fall risk assessment:    03/28/2020    2:07 PM  Fall Risk   Falls in the past year? 0  Number falls in past yr: 0     Most recent depression screenings:    03/28/2020    2:07 PM 04/07/2018    9:01 AM  PHQ 2/9 Scores  PHQ - 2 Score 0 0  PHQ- 9 Score 0     {VISON DENTAL STD PSA (Optional):27386}  {History (Optional):23778}  Patient Care Team: Sunnie Nielsen, DO as PCP - General (Osteopathic Medicine)   Outpatient Medications Prior to Visit  Medication Sig   acetaminophen (TYLENOL) 325 MG tablet Take 2 tablets (650 mg total) by mouth every 4 (four) hours as needed (for pain scale < 4).   fluticasone (FLONASE) 50 MCG/ACT nasal spray Place 2 sprays into both nostrils daily.   ibuprofen (ADVIL) 600 MG tablet Take 1 tablet (600 mg total) by mouth every 6 (six) hours.   Prenatal Vit-Fe Fumarate-FA (PRENATAL VITAMIN PO) Take 1 tablet by mouth daily.   No facility-administered medications prior to visit.    ROS        Objective:     There were no vitals taken for this visit. {Vitals History (Optional):23777}  Physical Exam   No results found for any visits on 04/21/22. {Show previous labs (optional):23779}    Assessment & Plan:    Routine Health Maintenance and Physical Exam  Immunization History  Administered Date(s) Administered   Influenza-Unspecified 08/03/2018, 07/14/2019   PFIZER(Purple Top)SARS-COV-2 Vaccination 10/25/2019, 11/15/2019   PPD  Test 12/15/2014, 12/27/2014   Tdap 08/28/2020    Health Maintenance  Topic Date Due   COVID-19 Vaccine (3 - Pfizer series) 01/10/2020   PAP SMEAR-Modifier  09/02/2021   Hepatitis C Screening  09/17/2022 (Originally 04/04/2010)   INFLUENZA VACCINE  05/13/2022   TETANUS/TDAP  08/28/2030   HIV Screening  Completed   HPV VACCINES  Aged Out    Discussed health benefits of physical activity, and encouraged her to engage in regular exercise appropriate for her age and condition.  Problem List Items Addressed This Visit   None Visit Diagnoses     Annual physical exam    -  Primary   Skin lesion          No follow-ups on file.     Christen Butter, NP

## 2022-04-21 ENCOUNTER — Encounter: Payer: No Typology Code available for payment source | Admitting: Medical-Surgical

## 2022-04-21 DIAGNOSIS — Z124 Encounter for screening for malignant neoplasm of cervix: Secondary | ICD-10-CM

## 2022-04-21 DIAGNOSIS — L989 Disorder of the skin and subcutaneous tissue, unspecified: Secondary | ICD-10-CM

## 2022-04-21 DIAGNOSIS — Z7689 Persons encountering health services in other specified circumstances: Secondary | ICD-10-CM

## 2022-04-21 DIAGNOSIS — Z0000 Encounter for general adult medical examination without abnormal findings: Secondary | ICD-10-CM

## 2022-04-29 ENCOUNTER — Ambulatory Visit (INDEPENDENT_AMBULATORY_CARE_PROVIDER_SITE_OTHER): Payer: No Typology Code available for payment source | Admitting: Sports Medicine

## 2022-04-29 ENCOUNTER — Encounter: Payer: Self-pay | Admitting: Sports Medicine

## 2022-04-29 VITALS — BP 120/69 | HR 108 | Ht 66.0 in | Wt 123.0 lb

## 2022-04-29 DIAGNOSIS — Z0000 Encounter for general adult medical examination without abnormal findings: Secondary | ICD-10-CM | POA: Diagnosis not present

## 2022-04-29 DIAGNOSIS — L918 Other hypertrophic disorders of the skin: Secondary | ICD-10-CM | POA: Diagnosis not present

## 2022-04-29 DIAGNOSIS — N6311 Unspecified lump in the right breast, upper outer quadrant: Secondary | ICD-10-CM

## 2022-04-29 NOTE — Assessment & Plan Note (Signed)
Skin tag mid back, cryotherapy today. Return to see me if not gone in 3 to 4 weeks.

## 2022-04-29 NOTE — Assessment & Plan Note (Signed)
Does have a right-sided breast mass just above the nipple, may be 2 to 3 cm, well-circumscribed, well-defined and movable. 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 No biopsy done. Considering findings I would like a tissue biopsy.

## 2022-04-29 NOTE — Assessment & Plan Note (Signed)
Pleasant and healthy 31 year old female, works for the [employer], happy. Up-to-date on screenings though she will need a Pap smear coming up. Checking routine labs.

## 2022-04-29 NOTE — Progress Notes (Signed)
Subjective:    CC: Annual Physical Exam  HPI:  This patient is here for their annual physical  I reviewed the past medical history, family history, social history, surgical history, and allergies today and no changes were needed.  Please see the problem list section below in epic for further details.  Past Medical History: Past Medical History:  Diagnosis Date   No known health problems    Past Surgical History: Past Surgical History:  Procedure Laterality Date   NO PAST SURGERIES     Social History: Social History   Socioeconomic History   Marital status: Married    Spouse name: N/A   Number of children: 0   Years of education: Not on file   Highest education level: Not on file  Occupational History   Occupation: Consulting civil engineer    Comment: [employer]  Tobacco Use   Smoking status: Never   Smokeless tobacco: Never  Vaping Use   Vaping Use: Never used  Substance and Sexual Activity   Alcohol use: Not Currently    Alcohol/week: 1.0 standard drink of alcohol    Types: 1 Standard drinks or equivalent per week   Drug use: Never   Sexual activity: Yes    Partners: Male    Birth control/protection: Condom  Other Topics Concern   Not on file  Social History Narrative   Studying Biology at [employer].  Hopes to become a Marine scientist. Lives off campus with a roommate. Family lives in [place], [place].   Social Determinants of Health   Financial Resource Strain: Not on file  Food Insecurity: Not on file  Transportation Needs: Not on file  Physical Activity: Not on file  Stress: Not on file  Social Connections: Not on file   Family History: Family History  Problem Relation Age of Onset   Diabetes Maternal Grandmother    Diabetes Paternal Grandmother    Breast cancer Neg Hx    Allergies: No Known Allergies Medications: See med rec.  Review of Systems: No headache, visual changes, nausea, vomiting, diarrhea, constipation, dizziness, abdominal pain, skin rash, fevers,  chills, night sweats, swollen lymph nodes, weight loss, chest pain, body aches, joint swelling, muscle aches, shortness of breath, mood changes, visual or auditory hallucinations.  Objective:    General: Well Developed, well nourished, and in no acute distress.  Neuro: Alert and oriented x3, extra-ocular muscles intact, sensation grossly intact. Cranial nerves II through XII are intact, motor, sensory, and coordinative functions are all intact. HEENT: Normocephalic, atraumatic, pupils equal round reactive to light, neck supple, no masses, no lymphadenopathy, thyroid nonpalpable. Oropharynx, nasopharynx, external ear canals are unremarkable. Skin: Warm and dry, no rashes noted.  Skin tag mid back. Cardiac: Regular rate and rhythm, no murmurs rubs or gallops.  Respiratory: Clear to auscultation bilaterally. Not using accessory muscles, speaking in full sentences.  Abdominal: Soft, nontender, nondistended, positive bowel sounds, no masses, no organomegaly.  Musculoskeletal: Shoulder, elbow, wrist, hip, knee, ankle stable, and with full range of motion.  Procedure:  Cryodestruction of mid back skin tag Consent obtained and verified. Time-out conducted. Noted no overlying erythema, induration, or other signs of local infection. Completed without difficulty using Cryo-Gun. Advised to call if fevers/chills, erythema, induration, drainage, or persistent bleeding.  Impression and Recommendations:    The patient was counselled, risk factors were discussed, anticipatory guidance given.  Annual physical exam Pleasant and healthy 31 year old female, works for the [employer], happy. Up-to-date on screenings though she will need a Pap smear coming up. Checking  routine labs.  Skin tag Skin tag mid back, cryotherapy today. Return to see me if not gone in 3 to 4 weeks.  Breast mass, right Does have a right-sided breast mass just above the nipple, may be 2 to 3 cm, well-circumscribed,  well-defined and movable. 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 No biopsy done. Considering findings I would like a tissue biopsy.   ____________________________________________ Ihor Austin. Benjamin Stain, M.D., ABFM., CAQSM., AME. Primary Care and Sports Medicine [facility]  Adjunct Professor of Family Medicine  [employer]  Restaurant manager, fast food

## 2022-05-13 ENCOUNTER — Telehealth: Payer: No Typology Code available for payment source | Admitting: Physician Assistant

## 2022-05-13 DIAGNOSIS — H9202 Otalgia, left ear: Secondary | ICD-10-CM | POA: Diagnosis not present

## 2022-05-13 MED ORDER — NEOMYCIN-POLYMYXIN-HC 3.5-10000-1 OT SOLN: 3.0000 [drp] | Freq: Four times a day (QID) | OTIC | 0 refills | Status: AC

## 2022-05-13 MED ORDER — FLUTICASONE PROPIONATE 50 MCG/ACT NA SUSP: 2.0000 | Freq: Every day | NASAL | 0 refills | Status: DC

## 2022-05-13 NOTE — Progress Notes (Signed)
Patient with one day of left ear pain, mainly with swallowing and talking. No external ear swelling, redness or drainage. No URI symptoms, fever, chills. Denies pressure in the ear. Came from a vacation where she was swimming a lot. Question eustachian tube dysfunction causing pain versus start of an otitis externa. Will start with Fluticasone nasal for potential ETD. Can use OTC decongestant as well along with OTC Tylenol. If not improving within 48 hours or any progression of pain, will have her start Cortisporin Otic and take as directed.

## 2022-05-20 NOTE — Progress Notes (Signed)
I have spent 5 minutes in review of e-visit questionnaire, review and updating patient chart, medical decision making and response to patient.   Kameah Rawl Cody Chevy Virgo, PA-C    

## 2022-08-28 ENCOUNTER — Encounter: Payer: Self-pay | Admitting: Sports Medicine

## 2022-10-13 NOTE — L&D Delivery Note (Signed)
Delivery Note At 5:11 PM a viable female was delivered via Vaginal, Spontaneous (Presentation: Right Occiput Anterior).  APGAR: 9/9, ; weight  7/10.   Placenta status: Spontaneous, Intact.  Cord: 3 vessels with the following complications: None.  Cord pH:n/a 3 pushes and delivery of head . Loose nuchal cord reduced and delivery of shoulders and body without difficulty . 60 sec delayed cord clamping .Placenta delivered 5 min later. Good uterine tone . Small 2cm first degree vaginal lac repaired with 00 vicryl  Anesthesia: Epidural Episiotomy: None Lacerations:  first degree Suture Repair: 2.0 vicryl Est. Blood Loss (mL):  100cc  Mom to postpartum.  Baby to Couplet care / Skin to Skin.  Ihor Austin Ibraheem Voris 07/30/2023, 5:21 PM

## 2022-12-18 DIAGNOSIS — N83292 Other ovarian cyst, left side: Secondary | ICD-10-CM | POA: Diagnosis not present

## 2022-12-18 DIAGNOSIS — N8311 Corpus luteum cyst of right ovary: Secondary | ICD-10-CM | POA: Diagnosis not present

## 2022-12-18 DIAGNOSIS — N912 Amenorrhea, unspecified: Secondary | ICD-10-CM | POA: Diagnosis not present

## 2023-01-16 DIAGNOSIS — Z1151 Encounter for screening for human papillomavirus (HPV): Secondary | ICD-10-CM | POA: Diagnosis not present

## 2023-01-16 DIAGNOSIS — Z131 Encounter for screening for diabetes mellitus: Secondary | ICD-10-CM | POA: Diagnosis not present

## 2023-01-16 DIAGNOSIS — Z3482 Encounter for supervision of other normal pregnancy, second trimester: Secondary | ICD-10-CM | POA: Diagnosis not present

## 2023-01-16 DIAGNOSIS — Z124 Encounter for screening for malignant neoplasm of cervix: Secondary | ICD-10-CM | POA: Diagnosis not present

## 2023-01-16 DIAGNOSIS — Z1329 Encounter for screening for other suspected endocrine disorder: Secondary | ICD-10-CM | POA: Diagnosis not present

## 2023-01-16 DIAGNOSIS — Z113 Encounter for screening for infections with a predominantly sexual mode of transmission: Secondary | ICD-10-CM | POA: Diagnosis not present

## 2023-01-16 LAB — OB RESULTS CONSOLE RUBELLA ANTIBODY, IGM: Rubella: IMMUNE

## 2023-01-16 LAB — OB RESULTS CONSOLE VARICELLA ZOSTER ANTIBODY, IGG: Varicella: IMMUNE

## 2023-01-16 LAB — OB RESULTS CONSOLE HEPATITIS B SURFACE ANTIGEN: Hepatitis B Surface Ag: NEGATIVE

## 2023-03-18 DIAGNOSIS — Z3482 Encounter for supervision of other normal pregnancy, second trimester: Secondary | ICD-10-CM | POA: Diagnosis not present

## 2023-03-23 DIAGNOSIS — Z3482 Encounter for supervision of other normal pregnancy, second trimester: Secondary | ICD-10-CM | POA: Diagnosis not present

## 2023-05-12 DIAGNOSIS — Z3483 Encounter for supervision of other normal pregnancy, third trimester: Secondary | ICD-10-CM | POA: Diagnosis not present

## 2023-05-12 DIAGNOSIS — Z23 Encounter for immunization: Secondary | ICD-10-CM | POA: Diagnosis not present

## 2023-06-30 DIAGNOSIS — Z3A35 35 weeks gestation of pregnancy: Secondary | ICD-10-CM | POA: Diagnosis not present

## 2023-06-30 DIAGNOSIS — Z2911 Encounter for prophylactic immunotherapy for respiratory syncytial virus (RSV): Secondary | ICD-10-CM | POA: Diagnosis not present

## 2023-07-08 ENCOUNTER — Other Ambulatory Visit: Payer: Self-pay

## 2023-07-08 DIAGNOSIS — Z113 Encounter for screening for infections with a predominantly sexual mode of transmission: Secondary | ICD-10-CM | POA: Diagnosis not present

## 2023-07-08 DIAGNOSIS — Z3483 Encounter for supervision of other normal pregnancy, third trimester: Secondary | ICD-10-CM | POA: Diagnosis not present

## 2023-07-08 DIAGNOSIS — Z114 Encounter for screening for human immunodeficiency virus [HIV]: Secondary | ICD-10-CM | POA: Diagnosis not present

## 2023-07-08 DIAGNOSIS — N898 Other specified noninflammatory disorders of vagina: Secondary | ICD-10-CM | POA: Diagnosis not present

## 2023-07-08 DIAGNOSIS — O99013 Anemia complicating pregnancy, third trimester: Secondary | ICD-10-CM | POA: Diagnosis not present

## 2023-07-08 LAB — OB RESULTS CONSOLE GBS: GBS: POSITIVE

## 2023-07-08 LAB — OB RESULTS CONSOLE GC/CHLAMYDIA
Chlamydia: NEGATIVE
Neisseria Gonorrhea: NEGATIVE

## 2023-07-08 LAB — OB RESULTS CONSOLE HIV ANTIBODY (ROUTINE TESTING): HIV: NONREACTIVE

## 2023-07-08 MED ORDER — FLUCONAZOLE 150 MG PO TABS
150.0000 mg | ORAL_TABLET | Freq: Once | ORAL | 0 refills | Status: AC
  Filled 2023-07-08: qty 1, 1d supply, fill #0

## 2023-07-22 ENCOUNTER — Other Ambulatory Visit: Payer: Self-pay | Admitting: Obstetrics and Gynecology

## 2023-07-22 DIAGNOSIS — Z3490 Encounter for supervision of normal pregnancy, unspecified, unspecified trimester: Secondary | ICD-10-CM

## 2023-07-22 NOTE — Progress Notes (Signed)
IOL 07/30/23

## 2023-07-27 ENCOUNTER — Other Ambulatory Visit: Payer: Self-pay

## 2023-07-27 MED ORDER — FLUCONAZOLE 150 MG PO TABS
150.0000 mg | ORAL_TABLET | Freq: Once | ORAL | 0 refills | Status: AC
  Filled 2023-07-27: qty 1, 1d supply, fill #0

## 2023-07-30 ENCOUNTER — Inpatient Hospital Stay
Admission: RE | Admit: 2023-07-30 | Discharge: 2023-07-31 | DRG: 806 | Disposition: A | Discharge status: Home / Self Care | Payer: 59 | Source: Ambulatory Visit | Attending: Obstetrics and Gynecology | Admitting: Obstetrics and Gynecology

## 2023-07-30 ENCOUNTER — Inpatient Hospital Stay: Payer: 59 | Admitting: General Practice

## 2023-07-30 ENCOUNTER — Other Ambulatory Visit: Payer: Self-pay

## 2023-07-30 DIAGNOSIS — Z3A39 39 weeks gestation of pregnancy: Secondary | ICD-10-CM | POA: Diagnosis not present

## 2023-07-30 DIAGNOSIS — D62 Acute posthemorrhagic anemia: Secondary | ICD-10-CM | POA: Diagnosis not present

## 2023-07-30 DIAGNOSIS — K219 Gastro-esophageal reflux disease without esophagitis: Secondary | ICD-10-CM | POA: Diagnosis present

## 2023-07-30 DIAGNOSIS — Z8616 Personal history of COVID-19: Secondary | ICD-10-CM | POA: Diagnosis not present

## 2023-07-30 DIAGNOSIS — O9962 Diseases of the digestive system complicating childbirth: Secondary | ICD-10-CM | POA: Diagnosis not present

## 2023-07-30 DIAGNOSIS — O99824 Streptococcus B carrier state complicating childbirth: Secondary | ICD-10-CM | POA: Diagnosis not present

## 2023-07-30 DIAGNOSIS — Z3490 Encounter for supervision of normal pregnancy, unspecified, unspecified trimester: Principal | ICD-10-CM | POA: Diagnosis present

## 2023-07-30 DIAGNOSIS — O26893 Other specified pregnancy related conditions, third trimester: Secondary | ICD-10-CM | POA: Diagnosis present

## 2023-07-30 DIAGNOSIS — Z833 Family history of diabetes mellitus: Secondary | ICD-10-CM

## 2023-07-30 DIAGNOSIS — O9081 Anemia of the puerperium: Secondary | ICD-10-CM | POA: Diagnosis not present

## 2023-07-30 LAB — CBC
HCT: 34.2 % — ABNORMAL LOW (ref 36.0–46.0)
Hemoglobin: 11.4 g/dL — ABNORMAL LOW (ref 12.0–15.0)
MCH: 30.2 pg (ref 26.0–34.0)
MCHC: 33.3 g/dL (ref 30.0–36.0)
MCV: 90.7 fL (ref 80.0–100.0)
Platelets: 213 10*3/uL (ref 150–400)
RBC: 3.77 MIL/uL — ABNORMAL LOW (ref 3.87–5.11)
RDW: 13.7 % (ref 11.5–15.5)
WBC: 8.5 10*3/uL (ref 4.0–10.5)
nRBC: 0 % (ref 0.0–0.2)

## 2023-07-30 LAB — TYPE AND SCREEN
ABO/RH(D): AB POS
Antibody Screen: NEGATIVE

## 2023-07-30 MED ORDER — DIPHENHYDRAMINE HCL 50 MG/ML IJ SOLN: 12.5000 mg | INTRAMUSCULAR | Status: DC | PRN

## 2023-07-30 MED ORDER — SODIUM CHLORIDE 0.9 % IV SOLN
5.0000 10*6.[IU] | Freq: Once | INTRAVENOUS | Status: AC
  Administered 2023-07-30: 5 10*6.[IU] via INTRAVENOUS

## 2023-07-30 MED ORDER — LIDOCAINE HCL (PF) 1 % IJ SOLN: 30.0000 mL | INTRAMUSCULAR | Status: DC | PRN

## 2023-07-30 MED ORDER — ACETAMINOPHEN 325 MG PO TABS: 650.0000 mg | ORAL_TABLET | ORAL | Status: DC | PRN

## 2023-07-30 MED ORDER — OXYTOCIN BOLUS FROM INFUSION
333.0000 mL | Freq: Once | INTRAVENOUS | Status: AC
  Administered 2023-07-30: 333 mL via INTRAVENOUS

## 2023-07-30 MED ORDER — PHENYLEPHRINE 80 MCG/ML (10ML) SYRINGE FOR IV PUSH (FOR BLOOD PRESSURE SUPPORT): 80.0000 ug | PREFILLED_SYRINGE | INTRAVENOUS | Status: DC | PRN

## 2023-07-30 MED ORDER — SOD CITRATE-CITRIC ACID 500-334 MG/5ML PO SOLN: 30.0000 mL | ORAL | Status: DC | PRN

## 2023-07-30 MED ORDER — FERROUS SULFATE 325 (65 FE) MG PO TABS
325.0000 mg | ORAL_TABLET | Freq: Two times a day (BID) | ORAL | Status: DC
  Administered 2023-07-31 (×2): 325 mg via ORAL
  Filled 2023-07-30 (×2): qty 1

## 2023-07-30 MED ORDER — MAGNESIUM HYDROXIDE 400 MG/5ML PO SUSP: 30.0000 mL | ORAL | Status: DC | PRN

## 2023-07-30 MED ORDER — MISOPROSTOL 25 MCG QUARTER TABLET: 25.0000 ug | ORAL_TABLET | ORAL | Status: DC | PRN

## 2023-07-30 MED ORDER — SODIUM CHLORIDE 0.9 % IV SOLN
INTRAVENOUS | Status: AC
  Filled 2023-07-30: qty 5

## 2023-07-30 MED ORDER — SIMETHICONE 80 MG PO CHEW: 80.0000 mg | CHEWABLE_TABLET | ORAL | Status: DC | PRN

## 2023-07-30 MED ORDER — COCONUT OIL OIL: 1.0000 | TOPICAL_OIL | Status: DC | PRN

## 2023-07-30 MED ORDER — LACTATED RINGERS IV SOLN
500.0000 mL | Freq: Once | INTRAVENOUS | Status: AC
  Administered 2023-07-30: 500 mL via INTRAVENOUS

## 2023-07-30 MED ORDER — FENTANYL-BUPIVACAINE-NACL 0.5-0.125-0.9 MG/250ML-% EP SOLN
EPIDURAL | Status: DC | PRN
  Administered 2023-07-30: 12 mL/h via EPIDURAL

## 2023-07-30 MED ORDER — LACTATED RINGERS IV SOLN: 500.0000 mL | INTRAVENOUS | Status: DC | PRN

## 2023-07-30 MED ORDER — MEASLES, MUMPS & RUBELLA VAC IJ SOLR
0.5000 mL | Freq: Once | INTRAMUSCULAR | Status: DC
  Filled 2023-07-30: qty 0.5

## 2023-07-30 MED ORDER — OXYCODONE-ACETAMINOPHEN 5-325 MG PO TABS: 2.0000 | ORAL_TABLET | ORAL | Status: DC | PRN

## 2023-07-30 MED ORDER — MISOPROSTOL 25 MCG QUARTER TABLET: 25.0000 ug | ORAL_TABLET | Freq: Once | ORAL | Status: DC

## 2023-07-30 MED ORDER — PRENATAL MULTIVITAMIN CH
1.0000 | ORAL_TABLET | Freq: Every day | ORAL | Status: DC
  Administered 2023-07-31: 1 via ORAL
  Filled 2023-07-30: qty 1

## 2023-07-30 MED ORDER — FENTANYL-BUPIVACAINE-NACL 0.5-0.125-0.9 MG/250ML-% EP SOLN: 12.0000 mL/h | EPIDURAL | Status: DC | PRN

## 2023-07-30 MED ORDER — BUTORPHANOL TARTRATE 1 MG/ML IJ SOLN
1.0000 mg | INTRAMUSCULAR | Status: DC | PRN
  Administered 2023-07-30: 1 mg via INTRAVENOUS
  Filled 2023-07-30: qty 1

## 2023-07-30 MED ORDER — EPHEDRINE 5 MG/ML INJ: 10.0000 mg | INTRAVENOUS | Status: DC | PRN

## 2023-07-30 MED ORDER — ZOLPIDEM TARTRATE 5 MG PO TABS: 5.0000 mg | ORAL_TABLET | Freq: Every evening | ORAL | Status: DC | PRN

## 2023-07-30 MED ORDER — IBUPROFEN 600 MG PO TABS
600.0000 mg | ORAL_TABLET | Freq: Four times a day (QID) | ORAL | Status: DC
  Administered 2023-07-30 – 2023-07-31 (×2): 600 mg via ORAL
  Filled 2023-07-30 (×2): qty 1

## 2023-07-30 MED ORDER — OXYCODONE HCL 5 MG PO TABS: 5.0000 mg | ORAL_TABLET | ORAL | Status: DC | PRN

## 2023-07-30 MED ORDER — TERBUTALINE SULFATE 1 MG/ML IJ SOLN: 0.2500 mg | Freq: Once | INTRAMUSCULAR | Status: DC | PRN

## 2023-07-30 MED ORDER — DIPHENHYDRAMINE HCL 25 MG PO CAPS: 25.0000 mg | ORAL_CAPSULE | Freq: Four times a day (QID) | ORAL | Status: DC | PRN

## 2023-07-30 MED ORDER — WITCH HAZEL-GLYCERIN EX PADS
1.0000 | MEDICATED_PAD | CUTANEOUS | Status: DC | PRN
  Administered 2023-07-30: 1 via TOPICAL
  Filled 2023-07-30 (×2): qty 100

## 2023-07-30 MED ORDER — OXYTOCIN-SODIUM CHLORIDE 30-0.9 UT/500ML-% IV SOLN
2.5000 [IU]/h | INTRAVENOUS | Status: DC
  Administered 2023-07-30: 2.5 [IU]/h via INTRAVENOUS

## 2023-07-30 MED ORDER — LACTATED RINGERS IV SOLN: INTRAVENOUS | Status: DC

## 2023-07-30 MED ORDER — BUPIVACAINE HCL (PF) 0.25 % IJ SOLN
INTRAMUSCULAR | Status: DC | PRN
  Administered 2023-07-30 (×2): 4 mL via EPIDURAL

## 2023-07-30 MED ORDER — FENTANYL-BUPIVACAINE-NACL 0.5-0.125-0.9 MG/250ML-% EP SOLN
EPIDURAL | Status: AC
  Filled 2023-07-30: qty 250

## 2023-07-30 MED ORDER — LIDOCAINE HCL (PF) 1 % IJ SOLN
INTRAMUSCULAR | Status: DC | PRN
  Administered 2023-07-30: 3 mL via SUBCUTANEOUS

## 2023-07-30 MED ORDER — ONDANSETRON HCL 4 MG/2ML IJ SOLN: 4.0000 mg | Freq: Four times a day (QID) | INTRAMUSCULAR | Status: DC | PRN

## 2023-07-30 MED ORDER — BENZOCAINE-MENTHOL 20-0.5 % EX AERO
1.0000 | INHALATION_SPRAY | CUTANEOUS | Status: DC | PRN
  Administered 2023-07-30: 1 via TOPICAL
  Filled 2023-07-30 (×2): qty 56

## 2023-07-30 MED ORDER — DIBUCAINE (PERIANAL) 1 % EX OINT
1.0000 | TOPICAL_OINTMENT | CUTANEOUS | Status: DC | PRN
  Administered 2023-07-30: 1 via RECTAL
  Filled 2023-07-30 (×2): qty 28

## 2023-07-30 MED ORDER — OXYTOCIN-SODIUM CHLORIDE 30-0.9 UT/500ML-% IV SOLN
1.0000 m[IU]/min | INTRAVENOUS | Status: DC
  Administered 2023-07-30: 4 m[IU]/min via INTRAVENOUS

## 2023-07-30 MED ORDER — ONDANSETRON HCL 4 MG PO TABS: 4.0000 mg | ORAL_TABLET | ORAL | Status: DC | PRN

## 2023-07-30 MED ORDER — PENICILLIN G POT IN DEXTROSE 60000 UNIT/ML IV SOLN
3.0000 10*6.[IU] | INTRAVENOUS | Status: DC
  Administered 2023-07-30: 3 10*6.[IU] via INTRAVENOUS
  Filled 2023-07-30 (×4): qty 50

## 2023-07-30 MED ORDER — OXYCODONE HCL 5 MG PO TABS: 10.0000 mg | ORAL_TABLET | ORAL | Status: DC | PRN

## 2023-07-30 MED ORDER — LIDOCAINE-EPINEPHRINE (PF) 1.5 %-1:200000 IJ SOLN
INTRAMUSCULAR | Status: DC | PRN
  Administered 2023-07-30: 3 mL via EPIDURAL

## 2023-07-30 MED ORDER — ONDANSETRON HCL 4 MG/2ML IJ SOLN: 4.0000 mg | INTRAMUSCULAR | Status: DC | PRN

## 2023-07-30 MED ORDER — OXYCODONE-ACETAMINOPHEN 5-325 MG PO TABS: 1.0000 | ORAL_TABLET | ORAL | Status: DC | PRN

## 2023-07-30 MED ORDER — SENNOSIDES-DOCUSATE SODIUM 8.6-50 MG PO TABS: 2.0000 | ORAL_TABLET | ORAL | Status: DC

## 2023-07-30 NOTE — Anesthesia Procedure Notes (Signed)
Epidural Patient location during procedure: OB Start time: 07/30/2023 2:33 PM End time: 07/30/2023 2:54 PM  Staffing Anesthesiologist: Stephanie Coup, MD Resident/CRNA: Irving Burton, CRNA Performed: resident/CRNA   Preanesthetic Checklist Completed: patient identified, IV checked, site marked, risks and benefits discussed, surgical consent, monitors and equipment checked, pre-op evaluation and timeout performed  Epidural Patient position: sitting Prep: ChloraPrep Patient monitoring: heart rate, continuous pulse ox and blood pressure Approach: midline Location: L3-L4 Injection technique: LOR air  Needle:  Needle type: Tuohy  Needle gauge: 17 G Needle length: 9 cm and 9 Needle insertion depth: 5 cm Catheter type: closed end flexible Catheter size: 19 Gauge Catheter at skin depth: 10 cm Test dose: negative and 1.5% lidocaine with Epi 1:200 K  Assessment Sensory level: T10 Events: blood not aspirated, no cerebrospinal fluid, injection not painful, no injection resistance, no paresthesia and negative IV test  Additional Notes 1 attempt Pt. Evaluated and documentation done after procedure finished. Patient identified. Risks/Benefits/Options discussed with patient including but not limited to bleeding, infection, nerve damage, paralysis, failed block, incomplete pain control, headache, blood pressure changes, nausea, vomiting, reactions to medication both or allergic, itching and postpartum back pain. Confirmed with bedside nurse the patient's most recent platelet count. Confirmed with patient that they are not currently taking any anticoagulation, have any bleeding history or any family history of bleeding disorders. Patient expressed understanding and wished to proceed. All questions were answered. Sterile technique was used throughout the entire procedure. Please see nursing notes for vital signs. Test dose was given through epidural catheter and negative prior to continuing  to dose epidural or start infusion. Warning signs of high block given to the patient including shortness of breath, tingling/numbness in hands, complete motor block, or any concerning symptoms with instructions to call for help. Patient was given instructions on fall risk and not to get out of bed. All questions and concerns addressed with instructions to call with any issues or inadequate analgesia.    Patient tolerated the insertion well without immediate complications.Reason for block:procedure for pain

## 2023-07-30 NOTE — Progress Notes (Signed)
Patricia Swanson is a 32 y.o. G2P1001 at [redacted]w[redacted]d Elective IOL  Comfortable with CLE in place  Pitocin augmentation  2nd dose of ABX in  Objective: BP 106/63   Pulse 61   Temp 98.1 F (36.7 C) (Oral)   Resp 16   Ht 5\' 6"  (1.676 m)   Wt 68 kg   SpO2 99%   BMI 24.21 kg/m  No intake/output data recorded. No intake/output data recorded.  FHT:  FHR: 130 bpm, variability: moderate,  accelerations:  Present,  decelerations:  Absent UC:   regular, every 3 minutes SVE:   Dilation: 7 Effacement (%): 70, 80 Station: Plus 2 Exam by:: T Ottis Sarnowski MD  Labs: Lab Results  Component Value Date   WBC 8.5 07/30/2023   HGB 11.4 (L) 07/30/2023   HCT 34.2 (L) 07/30/2023   MCV 90.7 07/30/2023   PLT 213 07/30/2023    Assessment / Plan: Spontaneous labor, progressing normally  Labor: Progressing normally Anticipated MOD:  NSVD  Suzy Bouchard, MD 07/30/2023, 4:50 PM

## 2023-07-30 NOTE — H&P (Signed)
Patricia Swanson is a 32 y.o. female presenting for IOL. 31 y.o. G2P1001 at [redacted]w[redacted]d LMP 10/24/22 consistent with  with ultrasound 12/18/22@[redacted]w[redacted]d  .Estimated Date of Delivery: 07/31/2023 Sex of baby and name:  boy "Patricia Swanson"  Partner:    Leta Jungling  Factors complicating this pregnancy  Anemia 05/12/23: Hgb 10.6, start ferrous sulfate 325mg  twice daily Screening results and needs: NOB: 01/16/2023 Medicaid Questionnaire:n/a  []  ACHD Program Depression Score: 0 MBT: AB pos  Ab screen:  negative   HIV:  NR  RPR:  NR Hep B: Neg Hep C: Neg Pap: 01/16/2023 Neg HPV: Neg G/C: Neg/Neg Rubella: Immune  VZV: Immune TSH: 2.318 HgA1C: 5.5 Aneuploidy:  First trimester:  MaternitT21: Negative/XY Second trimester (AFP/tetra): Negative 28 weeks:  Review Medicaid Questionnaire: []  ACHD Program Depression Score: 0 Blood consent: signed 05/12/23 Hgb: 10.6  Platelets: 193   Glucola: 90  Rhogam: N/A 36 weeks:  GBS: Positive    G/C:neg/neg   Hgb:10.7  Platelets:233    HIV:neg RPR:N/R    Last [place]:  12/18/22: Uterus retroverted Single,viable IUP,S=[redacted]w[redacted]d Yolk sac and amnion seen FHR=162bpm Cervical length=4.23cm Rt corpus luteum cyst=2.3cm Lt simple ovarian cyst=2.4cm   Free fluid seen near both ovaries 03/18/23: variable presentation ant plac EFW- 411 g @ 73%, AFI-WITHIN NORMAL LIMITS 07/28/23: SIngle IUP FHT 141 BPM. EFW=3663 g (8lbs1oz) = 60%. Pres=Ceph Plac=Anterior. AFI=10.7  Immunization:   Flu in season -Given at [facility] Tdap at 27-36 weeks - given 05/12/23 Covid-19 - 2021 x 2 RSV at 32-36 weeks - Given 06/30/23 [facility] Contraception Plan: vasectomy Feeding Plan: Breast  Labor Plans:Epidural    OB History     Gravida  2   Para  1   Term  1   Preterm  0   AB  0   Living  1      SAB  0   IAB  0   Ectopic  0   Multiple  0   Live Births  1          Past Medical History:  Diagnosis Date   No known health problems    Past Surgical History:  Procedure Laterality Date   NO PAST  SURGERIES     Family History: family history includes Diabetes in her maternal grandmother and paternal grandmother. Social History:  reports that she has never smoked. She has never used smokeless tobacco. She reports that she does not currently use alcohol after a past usage of about 1.0 standard drink of alcohol per week. She reports that she does not use drugs.      Review of Systems History Dilation: 2 Effacement (%): 50 Station: -2 Exam by:: TJS,MD Blood pressure 116/80, pulse 86, temperature 98.3 F (36.8 C), temperature source Oral, resp. rate 16, height 5\' 6"  (1.676 m), weight 68 kg, unknown if currently breastfeeding. Exam Physical Exam 116/80  Lungs CTA   CV RRR  EFM : 150, +accels , no decels. Cat I   Prenatal labs: ABO, Rh:AB+ Antibody: neg Rubella:  IMM, VZ : IMM RPR:   NR HBsAg:   neg , hep C neg  HIV:   neg  GBS:   +positive   Assessment/Plan: IOL ,  Start PCN got gbs prophylaxis Start Pitocin in 1 hr   Ihor Austin Teena Mangus 07/30/2023, 10:52 AM

## 2023-07-30 NOTE — Discharge Summary (Signed)
Obstetrical Discharge Summary  Patient Name: Patricia Swanson DOB: [birth year] MRN: [id number]  Date of Admission: 07/30/2023 Date of Delivery: 07/30/23 Delivered by: Patricia Gust MD Date of Discharge:07/31/2023   [facility] LMP:No LMP recorded. EDC Estimated Date of Delivery: 07/31/23 Gestational Age at Delivery: [redacted]w[redacted]d   Antepartum complications: maternal iron deficiency anemia  Admitting Diagnosis:  Secondary Diagnosis: Patient Active Problem List   Diagnosis Date Noted   Encounter for elective induction of labor 07/30/2023   Annual physical exam 04/29/2022   Skin tag 04/29/2022   Breast mass, right 09/02/2018    Induction: AROM and Pitocin Complications: None Intrapartum complications/course:  Date of Delivery: 07/31/2023  Delivered By: Patricia Gust MD Delivery Type: spontaneous vaginal delivery Anesthesia: epidural Placenta: spontaneous Laceration: first degree  Episiotomy: none Newborn Data: Female , apgars 9/9 wt 7/10#  Postpartum Procedures: None  Edinburgh:     10/28/2020    8:10 PM 10/28/2020   10:58 AM  Edinburgh Postnatal Depression Scale Screening Tool  I have been able to laugh and see the funny side of things. 0 --  I have looked forward with enjoyment to things. 0   I have blamed myself unnecessarily when things went wrong. 0   I have been anxious or worried for no good reason. 0   I have felt scared or panicky for no good reason. 0   Things have been getting on top of me. 1   I have been so unhappy that I have had difficulty sleeping. 0   I have felt sad or miserable. 0   I have been so unhappy that I have been crying. 0   The thought of harming myself has occurred to me. 0   Edinburgh Postnatal Depression Scale Total 1     Post partum course:  Patient had an uncomplicated postpartum course.  By time of discharge on PPD#1, her pain was controlled on oral pain medications; she had appropriate lochia and was  ambulating, voiding without difficulty and tolerating regular diet.  She was deemed stable for discharge to home.    Discharge Physical Exam:  BP 97/65   Pulse 78   Temp 98.3 F (36.8 C) (Oral)   Resp 18   Ht 5\' 6"  (1.676 m)   Wt 68 kg   SpO2 99%   Breastfeeding Unknown   BMI 24.21 kg/m   General: NAD CV: RRR Pulm: CTABL, nl effort ABD: s/nd/nt, fundus firm and below the umbilicus Lochia: moderate Perineum: minimal edema, intact  DVT Evaluation: LE non-ttp, no evidence of DVT on exam.  Hemoglobin  Date Value Ref Range Status  07/31/2023 10.3 (L) 12.0 - 15.0 g/dL Final  40/98/1191 47.8 (L) 11.1 - 15.9 g/dL Final   HCT  Date Value Ref Range Status  07/31/2023 30.3 (L) 36.0 - 46.0 % Final   Hematocrit  Date Value Ref Range Status  07/25/2020 32.6 (L) 34.0 - 46.6 % Final     Disposition: stable, discharge to home. Baby Feeding: breastmilk Baby Disposition: home with mom  Rh Immune globulin given:  Rubella vaccine given:  Tdap vaccine given in AP or PP setting:  Flu vaccine given in AP or PP setting:   Contraception: Condoms and eventually a vasectomy   Risk assessment for postpartum VTE and prophylactic treatment: Very high risk factors: None High risk factors: None Moderate risk factors: None  Postpartum VTE prophylaxis with LMWH not indicated  Prenatal Labs:   ABO, Rh:AB+ Antibody: neg Rubella:  IMM, VZ :  IMM RPR:   NR HBsAg:   neg , hep C neg  HIV:   neg  GBS:   +positive   Plan:  Patricia Swanson was discharged to home in good condition. Follow-up appointment with delivering provider in 6 weeks.  Discharge Medications: Allergies as of 07/31/2023   No Known Allergies      Medication List     TAKE these medications    acetaminophen 325 MG tablet Commonly known as: Tylenol Take 2 tablets (650 mg total) by mouth every 4 (four) hours as needed (for pain scale < 4).   fluticasone 50 MCG/ACT nasal spray Commonly known as:  FLONASE Place 2 sprays into both nostrils daily.   ibuprofen 600 MG tablet Commonly known as: ADVIL Take 1 tablet (600 mg total) by mouth every 6 (six) hours as needed for mild pain (pain score 1-3) or cramping.   prenatal multivitamin Tabs tablet Take 1 tablet by mouth daily at 12 noon.         Follow-up Information     Swanson, Patricia Austin, MD. Schedule an appointment as soon as possible for a visit in 6 week(s).   Specialty: Obstetrics and Gynecology Why: postpartum visit Contact information: [address] [place] [address] [phone]                 Signed:  Margaretmary Swanson, CNM Certified Nurse Midwife [facility]

## 2023-07-30 NOTE — Anesthesia Preprocedure Evaluation (Signed)
Anesthesia Evaluation  Patient identified by MRN, date of birth, ID band Patient awake    Reviewed: Allergy & Precautions, NPO status , Patient's Chart, lab work & pertinent test results  History of Anesthesia Complications Negative for: history of anesthetic complications  Airway Mallampati: II       Dental   Pulmonary neg sleep apnea, neg COPD, Not current smoker          Cardiovascular (-) hypertension(-) Past MI and (-) CHF (-) dysrhythmias (-) Valvular Problems/Murmurs     Neuro/Psych neg Seizures    GI/Hepatic Neg liver ROS,GERD (with pregnany)  Medicated,,  Endo/Other  neg diabetes    Renal/GU negative Renal ROS     Musculoskeletal   Abdominal   Peds  Hematology   Anesthesia Other Findings   Reproductive/Obstetrics                              Anesthesia Physical Anesthesia Plan  ASA: II  Anesthesia Plan: Epidural   Post-op Pain Management:    Induction:   PONV Risk Score and Plan:   Airway Management Planned:   Additional Equipment:   Intra-op Plan:   Post-operative Plan:   Informed Consent: I have reviewed the patients History and Physical, chart, labs and discussed the procedure including the risks, benefits and alternatives for the proposed anesthesia with the patient or authorized representative who has indicated his/her understanding and acceptance.       Plan Discussed with:   Anesthesia Plan Comments:          Anesthesia Quick Evaluation

## 2023-07-31 ENCOUNTER — Other Ambulatory Visit: Payer: Self-pay

## 2023-07-31 LAB — CBC
HCT: 30.3 % — ABNORMAL LOW (ref 36.0–46.0)
Hemoglobin: 10.3 g/dL — ABNORMAL LOW (ref 12.0–15.0)
MCH: 31.4 pg (ref 26.0–34.0)
MCHC: 34 g/dL (ref 30.0–36.0)
MCV: 92.4 fL (ref 80.0–100.0)
Platelets: 184 10*3/uL (ref 150–400)
RBC: 3.28 MIL/uL — ABNORMAL LOW (ref 3.87–5.11)
RDW: 13.7 % (ref 11.5–15.5)
WBC: 12 10*3/uL — ABNORMAL HIGH (ref 4.0–10.5)
nRBC: 0 % (ref 0.0–0.2)

## 2023-07-31 LAB — RPR: RPR Ser Ql: NONREACTIVE

## 2023-07-31 MED ORDER — IBUPROFEN 600 MG PO TABS
600.0000 mg | ORAL_TABLET | Freq: Four times a day (QID) | ORAL | 0 refills | Status: DC | PRN
  Filled 2023-07-31: qty 30, 8d supply, fill #0

## 2023-07-31 MED ORDER — ACETAMINOPHEN 325 MG PO TABS: 650.0000 mg | ORAL_TABLET | ORAL | Status: DC | PRN

## 2023-07-31 MED ORDER — IBUPROFEN 600 MG PO TABS
600.0000 mg | ORAL_TABLET | Freq: Four times a day (QID) | ORAL | Status: DC
  Administered 2023-07-31 (×2): 600 mg via ORAL
  Filled 2023-07-31 (×2): qty 1

## 2023-07-31 MED ORDER — SENNOSIDES-DOCUSATE SODIUM 8.6-50 MG PO TABS
2.0000 | ORAL_TABLET | ORAL | Status: DC
  Administered 2023-07-31: 2 via ORAL
  Filled 2023-07-31: qty 2

## 2023-07-31 NOTE — Progress Notes (Signed)
Postpartum Day  1  Subjective: 32 y.o. [id number] postpartum day #1 status post normal spontaneous vaginal delivery. She is ambulating, is tolerating po, is voiding spontaneously.  Her pain is well controlled on PO pain medications. Her lochia is less than menses.  Objective: BP 97/65   Pulse 78   Temp 98.3 F (36.8 C) (Oral)   Resp 18   Ht 5\' 6"  (1.676 m)   Wt 68 kg   SpO2 99%   Breastfeeding Unknown   BMI 24.21 kg/m    Physical Exam:  General: alert, cooperative, and no distress Breasts: soft/nontender Pulm: nl effort Abdomen: soft, non-tender, active bowel sounds Uterine Fundus: firm, slight deviation to right Perineum: minimal edema, intact Lochia: appropriate  Recent Labs    07/30/23 1019 07/31/23 0623  HGB 11.4* 10.3*  HCT 34.2* 30.3*  WBC 8.5 12.0*  PLT 213 184    Assessment/Plan: 32 y.o. G2P2002 postpartum day # 1  1. Continue routine postpartum care  2. Infant feeding status: breast feeding -Lactation consult PRN for breastfeeding  3. Contraception plan: condoms, vasectomy  4. Acute blood loss anemia - clinically significant.  -Hemodynamically stable and asymptomatic -Intervention: start on oral supplementation with ferrous sulfate 325 mg   5. Immunization status:   all immunizations up to date  Disposition: continue inpatient postpartum care , desires discharge home today    LOS: 1 day   Gustavo Lah, CNM 07/31/2023, 9:49 AM   ----- Margaretmary Eddy  Certified Nurse Midwife [facility]

## 2023-07-31 NOTE — Anesthesia Postprocedure Evaluation (Signed)
Anesthesia Post Note  Patient: Patricia Swanson  Procedure(s) Performed: AN AD HOC LABOR EPIDURAL  Patient location during evaluation: Mother Baby Anesthesia Type: Epidural Level of consciousness: awake and alert and oriented Pain management: pain level controlled Vital Signs Assessment: post-procedure vital signs reviewed and stable Respiratory status: respiratory function stable Cardiovascular status: stable Postop Assessment: no backache, no headache, patient able to bend at knees, no apparent nausea or vomiting, able to ambulate and adequate PO intake Anesthetic complications: no   No notable events documented.   Last Vitals:  Vitals:   07/30/23 2309 07/31/23 0326  BP: (!) 93/58 116/73  Pulse: 80 69  Resp:  18  Temp: 36.9 C 36.9 C  SpO2: 99% 99%    Last Pain:  Vitals:   07/31/23 0326  TempSrc: Oral  PainSc:                  Zachary George

## 2023-07-31 NOTE — Discharge Instructions (Signed)
Vaginal Delivery, Care After Refer to this sheet in the next few weeks. These discharge instructions provide you with information on caring for yourself after delivery. Your caregiver may also give you specific instructions. Your treatment has been planned according to the most current medical practices available, but problems sometimes occur. Call your caregiver if you have any problems or questions after you go home. HOME CARE INSTRUCTIONS Take over-the-counter or prescription medicines only as directed by your caregiver or pharmacist. Do not drink alcohol, especially if you are breastfeeding or taking medicine to relieve pain. Do not smoke tobacco. Continue to use good perineal care. Good perineal care includes: Wiping your perineum from back to front Keeping your perineum clean. You can do sitz baths twice a day, to help keep this area clean Do not use tampons, douche or have sex until your caregiver says it is okay. Shower only and avoid sitting in submerged water, aside from sitz baths Wear a well-fitting bra that provides breast support. Eat healthy foods. Drink enough fluids to keep your urine clear or pale yellow. Eat high-fiber foods such as whole grain cereals and breads, brown rice, beans, and fresh fruits and vegetables every day. These foods may help prevent or relieve constipation. Avoid constipation with high fiber foods or medications, such as miralax or metamucil Follow your caregiver's recommendations regarding resumption of activities such as climbing stairs, driving, lifting, exercising, or traveling. Talk to your caregiver about resuming sexual activities. Resumption of sexual activities is dependent upon your risk of infection, your rate of healing, and your comfort and desire to resume sexual activity. Try to have someone help you with your household activities and your newborn for at least a few days after you leave the hospital. Rest as much as possible. Try to rest or  take a nap when your newborn is sleeping. Increase your activities gradually. Keep all of your scheduled postpartum appointments. It is very important to keep your scheduled follow-up appointments. At these appointments, your caregiver will be checking to make sure that you are healing physically and emotionally. SEEK MEDICAL CARE IF:  You are passing large clots from your vagina. Save any clots to show your caregiver. You have a foul smelling discharge from your vagina. You have trouble urinating. You are urinating frequently. You have pain when you urinate. You have a change in your bowel movements. You have increasing redness, pain, or swelling near your vaginal incision (episiotomy) or vaginal tear. You have pus draining from your episiotomy or vaginal tear. Your episiotomy or vaginal tear is separating. You have painful, hard, or reddened breasts. You have a severe headache. You have blurred vision or see spots. You feel sad or depressed. You have thoughts of hurting yourself or your newborn. You have questions about your care, the care of your newborn, or medicines. You are dizzy or light-headed. You have a rash. You have nausea or vomiting. You were breastfeeding and have not had a menstrual period within 12 weeks after you stopped breastfeeding. You are not breastfeeding and have not had a menstrual period by the 12th week after delivery. You have a fever. SEEK IMMEDIATE MEDICAL CARE IF:  You have persistent pain. You have chest pain. You have shortness of breath. You faint. You have leg pain. You have stomach pain. Your vaginal bleeding saturates two or more sanitary pads in 1 hour. MAKE SURE YOU:  Understand these instructions. Will watch your condition. Will get help right away if you are not doing well or   get worse. Document Released: 09/26/2000 Document Revised: 02/13/2014 Document Reviewed: 05/26/2012 ExitCare Patient Information 2015 [employer]. This  information is not intended to replace advice given to you by your health care provider. Make sure you discuss any questions you have with your health care provider.  Sitz Bath A sitz bath is a warm water bath taken in the sitting position. The water covers only the hips and butt (buttocks). We recommend using one that fits in the toilet, to help with ease of use and cleanliness. It may be used for either healing or cleaning purposes. Sitz baths are also used to relieve pain, itching, or muscle tightening (spasms). The water may contain medicine. Moist heat will help you heal and relax.  HOME CARE  Take 3 to 4 sitz baths a day. Fill the bathtub half-full with warm water. Sit in the water and open the drain a little. Turn on the warm water to keep the tub half-full. Keep the water running constantly. Soak in the water for 15 to 20 minutes. After the sitz bath, pat the affected area dry. GET HELP RIGHT AWAY IF: You get worse instead of better. Stop the sitz baths if you get worse. MAKE SURE YOU: Understand these instructions. Will watch your condition. Will get help right away if you are not doing well or get worse. Document Released: 11/06/2004 Document Revised: 06/23/2012 Document Reviewed: 01/27/2011 Exit[employer] Patient Information 2015 [employer]. This information is not intended to replace advice given to you by your health care provider. Make sure you discuss any questions you have with your health care provider.   

## 2023-07-31 NOTE — Progress Notes (Signed)
Patient discharged. Discharge instructions given. Patient verbalizes understanding. Transported by staff. 

## 2023-08-15 ENCOUNTER — Telehealth: Payer: 59 | Admitting: Family

## 2023-08-15 DIAGNOSIS — I809 Phlebitis and thrombophlebitis of unspecified site: Secondary | ICD-10-CM | POA: Diagnosis not present

## 2023-08-15 NOTE — Patient Instructions (Signed)
Phlebitis Phlebitis is soreness and swelling of a vein. This can occur in the arms, legs, or torso, as well as deeper inside the body. Phlebitis is usually not serious when it occurs close to the surface of the body. However, it can cause serious problems when it occurs deeper inside the body. What are the causes? Phlebitis can be caused by: Having a needle, IV, or long, thin tube (catheter) put in the vein. Getting certain medicines or solutions through an IV. Some medicines or solutions, such as antibiotics and cancer medicines, can irritate the vein. Having an IV for a long period of time or in a part of the body that moves a lot. A blood clot. Infection of the vein. Surgery on a vein. What increases the risk? The following factors may make you more likely to develop this condition: Being overweight or obese. Pregnancy. Cancer. Reduced or slowing of blood flow through your veins. This can be caused by: Bed rest over a long period of time. Long-distance travel. Injury. Surgery. Congestive heart failure. Inactivity. Smoking. Taking birth control pills or hormone replacement therapy. Varicose veins. Inflammatory diseases or blood disorders that increase clotting. Taking drugs through the vein. Having a history of blood clots. What are the signs or symptoms? Symptoms of this condition include: A red, tender, swollen, and painful area on your skin. Usually, the area is long and narrow, and it may spread. The affected area feeling warm to the touch. Firmness along the center of the affected area. Low fever. How is this diagnosed? This condition is diagnosed based on: Your symptoms. A physical exam. Your medical history, including your family history. Tests may be done to rule out other conditions, such as blood clots, especially if you have a history of blood clots or are at higher risk of developing blood clots. These may include: Blood tests. Ultrasound exam. Genetic  tests. Biopsy. This is when a tissue sample is removed from the body and looked at under a microscope. This is rare. How is this treated? Treatment depends on the severity of the condition as well as the location of the inflammation. In most cases, the condition is minor and gets better quickly. Treatment may include: Applying a warm, wet cloth (warm compress) or heating pad. Wearing compression stockings or bandages. Medicines, such as: Anti-inflammatory medicines. Antibiotic medicines if an infection is present. Blood-thinning medicines if a blood clot is suspected or present, or if you have a history of blood clots or a blood disorder. Removing an IV that may be causing the problem. Using a different medicine or solution that will not irritate the vein. In rare cases, surgery may be needed to remove a damaged section of a vein. Follow these instructions at home: Managing pain, stiffness, and swelling  If directed, apply heat to the affected area as often as told by your health care provider. Use the heat source that your health care provider recommends, such as a moist heat pack or a heating pad. Place a towel between your skin and the heat source. Leave the heat on for 20-30 minutes. Remove the heat if your skin turns bright red. This is especially important if you are unable to feel pain, heat, or cold. You have a greater risk of getting burned. Raise (elevate) the affected area above the level of your heart while you are sitting or lying down. Medicines Take over-the-counter and prescription medicines only as told by your health care provider. If you were prescribed an antibiotic, take it  as told by your health care provider. Do not stop using the antibiotic even if you start to feel better. If you are taking blood thinners: Talk with your health care provider before you take any medicines that contain aspirin or NSAIDs, such as ibuprofen. These medicines increase your risk for  dangerous bleeding. Take your medicine exactly as told, at the same time every day. Avoid activities that could cause injury or bruising, and follow instructions about how to prevent falls. Wear a medical alert bracelet or carry a card that lists what medicines you take. General instructions If you have phlebitis in your legs: Avoid sitting or lying down for a long time. Keep your legs moving. Try to take short walks to break up long periods of sitting. Try to avoid bed rest that lasts for long periods of time. Regular sleep is not part of bed rest. Wear compression stockings or bandages as told by your health care provider. These stockings reduce swelling in your legs and help to prevent blood clots. They also help to prevent the condition from coming back. Do not use any products that contain nicotine or tobacco. These products include cigarettes, chewing tobacco, and vaping devices, such as e-cigarettes. If you need help quitting, ask your health care provider. Keep all follow-up visits. This is important. This may include any follow-up blood tests. Contact a health care provider if: You have unusual bruising or any bleeding problems. Your symptoms do not get better, or your symptoms get worse. You are on anti-inflammatory medicines and you develop abdominal pain or dark, tarry stools. Get help right away if: You suddenly have chest pain or trouble breathing. You have a fever and your symptoms suddenly get worse. You cough up blood. You feel light-headed or you faint. You have severe pain and swelling in the affected arm or leg. These symptoms may represent a serious problem that is an emergency. Do not wait to see if the symptoms will go away. Get medical help right away. Call your local emergency services (911 in the [place] Do not drive yourself to the hospital. Summary Phlebitis is soreness and swelling of a vein. Phlebitis is usually not serious when it occurs close to the surface of  the body. However, it can cause serious problems when it occurs deeper inside the body. Treatment depends on the severity of the condition and the location of the inflammation. Raise (elevate) the affected area above the level of your heart while you are sitting or lying down. This information is not intended to replace advice given to you by your health care provider. Make sure you discuss any questions you have with your health care provider. Document Revised: 05/28/2020 Document Reviewed: 05/28/2020 Elsevier Patient Education  2024 [employer].

## 2023-08-15 NOTE — Progress Notes (Signed)
Virtual Visit Consent   Patricia Swanson, you are scheduled for a virtual visit with a [facility] provider today. Just as with appointments in the office, your consent must be obtained to participate. Your consent will be active for this visit and any virtual visit you may have with one of our providers in the next 365 days. If you have a MyChart account, a copy of this consent can be sent to you electronically.  As this is a virtual visit, video technology does not allow for your provider to perform a traditional examination. This may limit your provider's ability to fully assess your condition. If your provider identifies any concerns that need to be evaluated in person or the need to arrange testing (such as labs, EKG, etc.), we will make arrangements to do so. Although advances in technology are sophisticated, we cannot ensure that it will always work on either your end or our end. If the connection with a video visit is poor, the visit may have to be switched to a telephone visit. With either a video or telephone visit, we are not always able to ensure that we have a secure connection.  By engaging in this virtual visit, you consent to the provision of healthcare and authorize for your insurance to be billed (if applicable) for the services provided during this visit. Depending on your insurance coverage, you may receive a charge related to this service.  I need to obtain your verbal consent now. Are you willing to proceed with your visit today? [name] has provided verbal consent on 08/15/2023 for a virtual visit (video or telephone). Jannifer Rodney, FNP  Date: 08/15/2023 9:05 AM  Virtual Visit via Video Note   I, Jannifer Rodney, connected with  Searra Carnathan  ([id number], 03/15/1991) on 08/15/23 at  9:00 AM EDT by a video-enabled telemedicine application and verified that I am speaking with the correct person using two identifiers.  Location: Patient: Virtual Visit  Location Patient: Home Provider: Virtual Visit Location Provider: Home Office   I discussed the limitations of evaluation and management by telemedicine and the availability of in person appointments. The patient expressed understanding and agreed to proceed.    History of Present Illness: Patricia Swanson is a 32 y.o. who identifies as a female who was assigned female at birth, and is being seen today for right forearm redness and tenderness. She gave birth two weeks ago and had trouble getting an IV. She reports mild aching pain 3 out 10, but when the area is touched it is a 7 out 10.   HPI: HPI  Problems:  Patient Active Problem List   Diagnosis Date Noted   Encounter for elective induction of labor 07/30/2023   Annual physical exam 04/29/2022   Skin tag 04/29/2022   Breast mass, right 09/02/2018    Allergies: No Known Allergies Medications:  Current Outpatient Medications:    acetaminophen (TYLENOL) 325 MG tablet, Take 2 tablets (650 mg total) by mouth every 4 (four) hours as needed (for pain scale < 4)., Disp: , Rfl:    fluticasone (FLONASE) 50 MCG/ACT nasal spray, Place 2 sprays into both nostrils daily., Disp: 16 g, Rfl: 0   ibuprofen (ADVIL) 600 MG tablet, Take 1 tablet (600 mg total) by mouth every 6 (six) hours as needed for mild pain (pain score 1-3) or cramping., Disp: 30 tablet, Rfl: 0   Prenatal Vit-Fe Fumarate-FA (PRENATAL MULTIVITAMIN) TABS tablet, Take 1 tablet by mouth daily at 12 noon.,  Disp: , Rfl:   Observations/Objective: Patient is well-developed, well-nourished in no acute distress.  Resting comfortably  at home.  Head is normocephalic, atraumatic.  No labored breathing.  Speech is clear and coherent with logical content.  Patient is alert and oriented at baseline.  No bruising or erythemas noted, mild tenderness in right AC  Assessment and Plan: 1. Phlebitis  Start taking ibuprofen TID prn  Cool compression  Elevated Report worsening redness,  swelling, or pain  Follow Up Instructions: I discussed the assessment and treatment plan with the patient. The patient was provided an opportunity to ask questions and all were answered. The patient agreed with the plan and demonstrated an understanding of the instructions.  A copy of instructions were sent to the patient via MyChart unless otherwise noted below.     The patient was advised to call back or seek an in-person evaluation if the symptoms worsen or if the condition fails to improve as anticipated.    Jannifer Rodney, FNP

## 2023-09-14 DIAGNOSIS — B354 Tinea corporis: Secondary | ICD-10-CM | POA: Diagnosis not present

## 2023-09-20 ENCOUNTER — Telehealth: Payer: 59 | Admitting: Family Medicine

## 2023-09-20 DIAGNOSIS — J029 Acute pharyngitis, unspecified: Secondary | ICD-10-CM

## 2023-09-20 NOTE — Progress Notes (Signed)
E-Visit for Sore Throat  We are sorry that you are not feeling well.  Here is how we plan to help!  Your symptoms indicate a likely viral infection (Pharyngitis).   Pharyngitis is inflammation in the back of the throat which can cause a sore throat, scratchiness and sometimes difficulty swallowing.   Pharyngitis is typically caused by a respiratory virus and will just run its course.  Please keep in mind that your symptoms could last up to 10 days.  For throat pain, we recommend over the counter oral pain relief medications such as acetaminophen or aspirin, or anti-inflammatory medications such as ibuprofen or naproxen sodium.  Topical treatments such as oral throat lozenges or sprays may be used as needed.  Avoid close contact with loved ones, especially the very young and elderly.  Remember to wash your hands thoroughly throughout the day as this is the number one way to prevent the spread of infection and wipe down door knobs and counters with disinfectant.  After careful review of your answers, I would not recommend an antibiotic for your condition.  Antibiotics should not be used to treat conditions that we suspect are caused by viruses like the virus that causes the common cold or flu. However, some people can have Strep with atypical symptoms. You may need formal testing in clinic or office to confirm if your symptoms continue or worsen.  Providers prescribe antibiotics to treat infections caused by bacteria. Antibiotics are very powerful in treating bacterial infections when they are used properly.  To maintain their effectiveness, they should be used only when necessary.  Overuse of antibiotics has resulted in the development of super bugs that are resistant to treatment!    Home Care: Only take medications as instructed by your medical team. Do not drink alcohol while taking these medications. A steam or ultrasonic humidifier can help congestion.  You can place a towel over your head and  breathe in the steam from hot water coming from a faucet. Avoid close contacts especially the very young and the elderly. Cover your mouth when you cough or sneeze. Always remember to wash your hands.  Get Help Right Away If: You develop worsening fever or throat pain. You develop a severe head ache or visual changes. Your symptoms persist after you have completed your treatment plan.  Make sure you Understand these instructions. Will watch your condition. Will get help right away if you are not doing well or get worse.   Thank you for choosing an e-visit.  Your e-visit answers were reviewed by a board certified advanced clinical practitioner to complete your personal care plan. Depending upon the condition, your plan could have included both over the counter or prescription medications.  Please review your pharmacy choice. Make sure the pharmacy is open so you can pick up prescription now. If there is a problem, you may contact your provider through MyChart messaging and have the prescription routed to another pharmacy.  Your safety is important to us. If you have drug allergies check your prescription carefully.   For the next 24 hours you can use MyChart to ask questions about today's visit, request a non-urgent call back, or ask for a work or school excuse. You will get an email in the next two days asking about your experience. I hope that your e-visit has been valuable and will speed your recovery.    have provided 5 minutes of non face to face time during this encounter for chart review and documentation.   

## 2024-06-14 ENCOUNTER — Encounter: Payer: Self-pay | Admitting: Sports Medicine

## 2024-07-20 ENCOUNTER — Telehealth: Admitting: Family Medicine

## 2024-07-20 DIAGNOSIS — B359 Dermatophytosis, unspecified: Secondary | ICD-10-CM | POA: Diagnosis not present

## 2024-07-20 MED ORDER — TERBINAFINE HCL 250 MG PO TABS: 250.0000 mg | ORAL_TABLET | Freq: Every day | ORAL | 0 refills | Status: DC

## 2024-07-20 NOTE — Progress Notes (Signed)
 E-Visit for Ringworm  We are sorry that you are not feeling well. Here is how we plan to help!  Based on what you shared with me it looks like you have tinea corporis, or "Ringworm".  The symptoms of ringworm include red, peeling, itchy rash that affects the skin.  This fungal infection can be spread through shared towels, clothing, bedding, or hard surfaces (particularly in moist areas) such as shower stalls, locker room floors, or pool area that has the fungus present. If you have a fungal infection on one part of your body, you can also spread it to other parts. For instance, men with a fungal infection on their feet sometimes spread it to their groin.  I am recommending:Clotrimazole 1% cream or gel, apply to area twice per day, continue; available OTC   Prescription medications are only indicated for an extensive rash or if over the counter treatments have failed.  I am prescribing:Terbinafine 250 mg once per day for one week  HOME CARE:  Keep affected area clean, dry, and cool. Wash with soap and shampoo after sports or exercise and dry yourself well after bathing or swimming Wear cotton underwear and change them if they become damp or sweaty. Avoid using swimming pools, public showers, or baths.  GET HELP RIGHT AWAY IF:  Symptoms that don't away after treatment. Severe itching that persists. If your rash spreads or swells. If your rash begins to have drainage or smell. You develop a fever.  MAKE SURE YOU   Understand these instructions. Will watch your condition. Will get help right away if you are not doing well or get worse.  Thank you for choosing an e-visit.  Your e-visit answers were reviewed by a board certified advanced clinical practitioner to complete your personal care plan. Depending upon the condition, your plan could have included both over the counter or prescription medications.  Please review your pharmacy choice. Make sure the pharmacy is open so you can  pick up prescription now. If there is a problem, you may contact your provider through [employer] and have the prescription routed to another pharmacy.  Your safety is important to us . If you have drug allergies check your prescription carefully.   For the next 24 hours you can use MyChart to ask questions about today's visit, request a non-urgent call back, or ask for a work or school excuse.  You will get an email in the next two days asking about your experience. I hope that your e-visit has been valuable and will speed your recovery   References or for more information:  [email or web address]=[email or web address]=search_result&selectedTitle=3~52&usage_type=default&display_rank=3   I have spent 5 minutes in review of e-visit questionnaire, review and updating patient chart, medical decision making and response to patient.   Delon CHRISTELLA Dickinson, PA-C

## 2024-08-13 ENCOUNTER — Telehealth

## 2024-08-13 DIAGNOSIS — B3731 Acute candidiasis of vulva and vagina: Secondary | ICD-10-CM | POA: Diagnosis not present

## 2024-08-14 MED ORDER — FLUCONAZOLE 150 MG PO TABS
150.0000 mg | ORAL_TABLET | ORAL | 0 refills | Status: AC | PRN
Start: 2024-08-14 — End: ?

## 2024-08-14 NOTE — Progress Notes (Signed)
